# Patient Record
Sex: Female | Born: 1975 | Hispanic: Yes | Marital: Married | Smoking: Never smoker
Health system: Southern US, Community
[De-identification: ages and names within clinical notes are randomized; demographics above are authoritative.]

## PROBLEM LIST (undated history)

## (undated) DIAGNOSIS — F32A Depression, unspecified: Secondary | ICD-10-CM

## (undated) DIAGNOSIS — F419 Anxiety disorder, unspecified: Secondary | ICD-10-CM

## (undated) DIAGNOSIS — R87629 Unspecified abnormal cytological findings in specimens from vagina: Secondary | ICD-10-CM

## (undated) HISTORY — PX: OTHER SURGICAL HISTORY: SHX169

## (undated) HISTORY — DX: Anxiety disorder, unspecified: F41.9

## (undated) HISTORY — DX: Unspecified abnormal cytological findings in specimens from vagina: R87.629

## (undated) HISTORY — PX: GYNECOLOGIC CRYOSURGERY: SHX857

## (undated) HISTORY — DX: Depression, unspecified: F32.A

---

## 2011-11-30 ENCOUNTER — Encounter: Payer: Self-pay | Admitting: Obstetrics & Gynecology

## 2013-02-23 ENCOUNTER — Ambulatory Visit: Payer: Self-pay | Admitting: Physician Assistant

## 2013-02-23 ENCOUNTER — Ambulatory Visit (INDEPENDENT_AMBULATORY_CARE_PROVIDER_SITE_OTHER): Payer: BC Managed Care – PPO | Admitting: Physician Assistant

## 2013-02-23 ENCOUNTER — Encounter: Payer: Self-pay | Admitting: Physician Assistant

## 2013-02-23 VITALS — BP 82/49 | HR 69 | Wt 129.0 lb

## 2013-02-23 DIAGNOSIS — F32A Depression, unspecified: Secondary | ICD-10-CM

## 2013-02-23 DIAGNOSIS — R5381 Other malaise: Secondary | ICD-10-CM

## 2013-02-23 DIAGNOSIS — F329 Major depressive disorder, single episode, unspecified: Secondary | ICD-10-CM

## 2013-02-23 DIAGNOSIS — O99345 Other mental disorders complicating the puerperium: Secondary | ICD-10-CM

## 2013-02-23 DIAGNOSIS — R5383 Other fatigue: Secondary | ICD-10-CM

## 2013-02-23 NOTE — Progress Notes (Signed)
  Subjective:    Patient ID: Heather Haas, female    DOB: [birth year], 37 y.o.   MRN: [id number]  HPI Patient is a 37 yo female who presents to the clinic to establish care. NO PMH. NOt on any current medications. She did not like her previous doctor because he never explained anything to her and she was sick last year with abdominal pain and had all kinds of treatment but doesn't really know what happened. Abominal pain has resolved.   Pt presents to the clinic today with fatigue. She has had depression on and off since having her son 9 years ago. She is not currently on any medications. She sleeps fine more than 8 hours a night. She feels rested when she wakes up and no snoring. She has not had labs checked. She does not exercise regularly. Nothing seems to make fatigue worse or better.     Review of Systems  All other systems reviewed and are negative.       Objective:   Physical Exam  Constitutional: She is oriented to person, place, and time. She appears well-developed and well-nourished.  HENT:  Head: Normocephalic and atraumatic.  Eyes: Conjunctivae are normal.  Neck: Normal range of motion. Neck supple. No thyromegaly present.  Cardiovascular: Normal rate, regular rhythm and normal heart sounds.   Pulmonary/Chest: Effort normal and breath sounds normal.  Neurological: She is alert and oriented to person, place, and time.  Skin: Skin is warm and dry.  Psychiatric: She has a normal mood and affect. Her behavior is normal.          Assessment & Plan:  Fatigue- Will start with CBC/TSH/Vitamin D/B12. Certainly could be due to depression. Will get labs first and then discuss treatment for depression.

## 2013-02-24 LAB — CBC WITH DIFFERENTIAL/PLATELET
Basophils Relative: 0 % (ref 0–1)
Eosinophils Absolute: 0.3 10*3/uL (ref 0.0–0.7)
Eosinophils Relative: 4 % (ref 0–5)
HCT: 36.6 % (ref 36.0–46.0)
Hemoglobin: 12.6 g/dL (ref 12.0–15.0)
MCH: 28.5 pg (ref 26.0–34.0)
MCHC: 34.4 g/dL (ref 30.0–36.0)
MCV: 82.8 fL (ref 78.0–100.0)
Monocytes Absolute: 0.4 10*3/uL (ref 0.1–1.0)
Monocytes Relative: 5 % (ref 3–12)

## 2013-02-24 LAB — BASIC METABOLIC PANEL
BUN: 7 mg/dL (ref 6–23)
Chloride: 106 mEq/L (ref 96–112)
Glucose, Bld: 122 mg/dL — ABNORMAL HIGH (ref 70–99)
Potassium: 4.1 mEq/L (ref 3.5–5.3)

## 2013-02-25 DIAGNOSIS — F53 Postpartum depression: Secondary | ICD-10-CM | POA: Insufficient documentation

## 2013-02-25 DIAGNOSIS — O99345 Other mental disorders complicating the puerperium: Secondary | ICD-10-CM | POA: Insufficient documentation

## 2013-02-26 ENCOUNTER — Other Ambulatory Visit: Payer: Self-pay | Admitting: Physician Assistant

## 2013-02-26 MED ORDER — BUPROPION HCL ER (XL) 150 MG PO TB24: 150.0000 mg | ORAL_TABLET | Freq: Every day | ORAL | Status: DC

## 2013-03-02 ENCOUNTER — Encounter: Payer: Self-pay | Admitting: Physician Assistant

## 2013-03-02 ENCOUNTER — Ambulatory Visit (INDEPENDENT_AMBULATORY_CARE_PROVIDER_SITE_OTHER): Payer: BC Managed Care – PPO | Admitting: Physician Assistant

## 2013-03-02 VITALS — BP 90/55 | HR 61 | Wt 129.0 lb

## 2013-03-02 DIAGNOSIS — R1011 Right upper quadrant pain: Secondary | ICD-10-CM

## 2013-03-02 DIAGNOSIS — T148 Other injury of unspecified body region: Secondary | ICD-10-CM

## 2013-03-02 DIAGNOSIS — W57XXXA Bitten or stung by nonvenomous insect and other nonvenomous arthropods, initial encounter: Secondary | ICD-10-CM

## 2013-03-02 DIAGNOSIS — G589 Mononeuropathy, unspecified: Secondary | ICD-10-CM

## 2013-03-02 DIAGNOSIS — G629 Polyneuropathy, unspecified: Secondary | ICD-10-CM

## 2013-03-02 LAB — GLUCOSE, RANDOM: Glucose, Bld: 91 mg/dL (ref 70–99)

## 2013-03-02 LAB — HEPATIC FUNCTION PANEL
ALT: 10 U/L (ref 0–35)
AST: 14 U/L (ref 0–37)
Albumin: 4.7 g/dL (ref 3.5–5.2)
Alkaline Phosphatase: 41 U/L (ref 39–117)

## 2013-03-02 MED ORDER — GABAPENTIN 100 MG PO CAPS: 100.0000 mg | ORAL_CAPSULE | Freq: Every day | ORAL | Status: DC

## 2013-03-02 NOTE — Patient Instructions (Signed)
Gabapentin to start at night 1 hour before bed if improving can start taking 1 pill in the morning.   Continue on Wellbutrin every morning.   Follow up in 6 weeks.

## 2013-03-02 NOTE — Progress Notes (Signed)
  Subjective:    Patient ID: Heather Haas, female    DOB: 1976-08-05, 37 y.o.   MRN: [id number]  HPI Patient presents to the clinic to follow up on fatigue and burning in both feet. She came in to establish care but was not very clear about her main concern which was numbness and burning of both feet. She reports it has been going on for over 6 months. Nothing makes worse or better.Burning does tend to be worse at night than in the morning. Screening for diabetes have always been negative. She was bit by a tick and concerned about lymes. Does not remember rash. Denies any fever, chills, muscle aches. We did just recently start on Wellbutrin. No complaints and does feel like she has a little more energy and happier. Pt is concerned about liver disease and hepatitis would like to make sure liver is fine.   Review of Systems     Objective:   Physical Exam  Constitutional: She appears well-developed and well-nourished.  HENT:  Head: Normocephalic and atraumatic.  Cardiovascular: Normal rate, regular rhythm, normal heart sounds and intact distal pulses.   Pulmonary/Chest: Effort normal and breath sounds normal.  Abdominal: Soft. Bowel sounds are normal. There is no tenderness.  NO organomegaly.  Neurological: She is alert.  Skin: Skin is warm and dry.  Bilateral legs and feet unremarkable. Capillary refill under 3 sec.   Psychiatric: Her behavior is normal.  Flat affect.           Assessment & Plan:  Neuropathy/tick bite/Fatigue/depression- will test for lyme's. B12 was normal. Have not gotten a recent glucose fasting. Will get that. Pt concerned about liver disease. Will check for patient. Started Neurontin to see if helps. Continue on wellbutrin to help with fatigue and depression.

## 2013-03-14 ENCOUNTER — Encounter: Payer: Self-pay | Admitting: Physician Assistant

## 2013-03-14 ENCOUNTER — Ambulatory Visit (INDEPENDENT_AMBULATORY_CARE_PROVIDER_SITE_OTHER): Payer: BC Managed Care – PPO | Admitting: Physician Assistant

## 2013-03-14 VITALS — BP 100/60 | HR 79 | Wt 127.0 lb

## 2013-03-14 DIAGNOSIS — G479 Sleep disorder, unspecified: Secondary | ICD-10-CM

## 2013-03-14 DIAGNOSIS — R220 Localized swelling, mass and lump, head: Secondary | ICD-10-CM

## 2013-03-14 DIAGNOSIS — R221 Localized swelling, mass and lump, neck: Secondary | ICD-10-CM

## 2013-03-14 MED ORDER — FLUTICASONE PROPIONATE 50 MCG/ACT NA SUSP: 2.0000 | Freq: Every day | NASAL | Status: DC

## 2013-03-14 NOTE — Progress Notes (Signed)
  Subjective:    Patient ID: Heather Haas, female    DOB: June 05, 1976, 37 y.o.   MRN: [id number]  HPI Patient presents to the clinic with trouble sleeping for last week. Pt speaks english but it is not her primary language. She reports she is not sleepy and can not fall asleep until 5 in the morning but still wake up 2-3 hours later. She admits to stress but not able to articulate if something major has happened this week. She admits that she feels better and that her leg numbness has improved. She has been taking wellbutrin in the am since early June with no side effects.   She feels like there is a lump in her throat. Denies ST but feels like something can't get out. Pt has an occasional cough and dry. Denies any fever, ear pain, sinus pressure, SOB/Wheezing. She is concerned and wants to be tested for HPV. She states that she was told she needed to be. Denies oral sex. One parther.      Review of Systems     Objective:   Physical Exam  Constitutional: She appears well-developed and well-nourished.  HENT:  Head: Normocephalic and atraumatic.  Right Ear: External ear normal.  Left Ear: External ear normal.  Nose: Nose normal.  Mouth/Throat: Oropharynx is clear and moist. No oropharyngeal exudate.  Eyes: Conjunctivae are normal.  Neck: Normal range of motion. Neck supple.  Cardiovascular: Normal rate, regular rhythm and normal heart sounds.   Pulmonary/Chest: Effort normal and breath sounds normal. She has no wheezes.  Lymphadenopathy:    She has no cervical adenopathy.  Skin: Skin is warm and dry.  Psychiatric: Her behavior is normal.  Flat affect          Assessment & Plan:  Trouble sleeping- I will consider that wellbutrin could be causing. Taper to every other day for 3 days and then stop. See if sleeping is better. If not then start back on wellbutrin since helping. And try melatonin or valarian root for next couple of weeks. Follow up in 1 to 2 months.   Lump in throat- I  suspect allergies. Discussed taking zyrtec D daily and sent to pharmacy a nasal spray. Pt very concerned. I reassured I could send to ENT if not improving. I gave her CDC recommendations or oral HPV screening. We do not do in office. I told her no lesions were seen in mouth today. Not particpating in oral sex also decreases chances. If would like to pursue testing would nee to go to dentist or call around to somewhere that test.   Spent 30 minutes with patient and greater than 50 percent of time was spent counseling patient regarding HPV oral and lump in throat concerns.

## 2013-03-14 NOTE — Patient Instructions (Addendum)
Melatonin 3mg  to 10mg  1 hour before bedtime.  valarian Root 300mg  1 hour before bedtime.   Stop Wellbutrin for 2 weeks and see if start sleeping better. If sleeping better then will consider another antidepressant  Zyrtec D daily along with flonase 2 sprays each nostril once a day.

## 2013-04-13 ENCOUNTER — Ambulatory Visit (INDEPENDENT_AMBULATORY_CARE_PROVIDER_SITE_OTHER): Payer: BC Managed Care – PPO | Admitting: Physician Assistant

## 2013-04-13 ENCOUNTER — Encounter: Payer: Self-pay | Admitting: Physician Assistant

## 2013-04-13 VITALS — BP 101/62 | HR 70 | Wt 124.0 lb

## 2013-04-13 DIAGNOSIS — R198 Other specified symptoms and signs involving the digestive system and abdomen: Secondary | ICD-10-CM

## 2013-04-13 DIAGNOSIS — F53 Postpartum depression: Secondary | ICD-10-CM

## 2013-04-13 DIAGNOSIS — M62838 Other muscle spasm: Secondary | ICD-10-CM

## 2013-04-13 DIAGNOSIS — F329 Major depressive disorder, single episode, unspecified: Secondary | ICD-10-CM

## 2013-04-13 DIAGNOSIS — O99345 Other mental disorders complicating the puerperium: Secondary | ICD-10-CM

## 2013-04-13 LAB — POCT URINALYSIS DIPSTICK
Protein, UA: NEGATIVE
Spec Grav, UA: 1.015
Urobilinogen, UA: 0.2

## 2013-04-13 MED ORDER — CYCLOBENZAPRINE HCL 10 MG PO TABS
10.0000 mg | ORAL_TABLET | Freq: Two times a day (BID) | ORAL | Status: DC | PRN
Start: 2013-04-13 — End: 2014-02-05

## 2013-04-13 MED ORDER — BUPROPION HCL ER (XL) 150 MG PO TB24: 150.0000 mg | ORAL_TABLET | Freq: Every day | ORAL | Status: DC

## 2013-04-13 MED ORDER — MELOXICAM 15 MG PO TABS: 15.0000 mg | ORAL_TABLET | Freq: Every day | ORAL | Status: DC

## 2013-04-13 MED ORDER — GABAPENTIN 100 MG PO CAPS: ORAL_CAPSULE | ORAL | Status: DC

## 2013-04-13 NOTE — Patient Instructions (Addendum)
Align probiotic.   Mobic every day for 2 weeks. Consider massage. Flexeril use as needed for muscle tightness and spasm.  Use heat and ice. Consider epson salt baths.

## 2013-04-13 NOTE — Progress Notes (Signed)
  Subjective:    Patient ID: Heather Haas, female    DOB: October 23, 1975, 37 y.o.   MRN: [id number]  HPI Patient presents to the clinic to follow up on post partum depression. She is doing much better with wellburtrin. She needs a refill today. She has more energy. She denies any suicidal thoughts. She is trying to be more active but not very consistent.   Her restless legs are also better with neurontin as needed at bedtime.   She has had some abdominal fullness for the last couple of days and wants to make sure it is not an infection. She denies any discharge, abdominal pain, dysuria, urine odor, back pain, itching. She has not had any fever, chills, muscle aches.   She has had some neck pain and tightness. She describes it as a cold sensation over her back and up into her neck. She has no problem moving her neck. She denies any trauma. She does not feel ill. She has not tried anything to make better. Nothing seems to make worse.     Review of Systems     Objective:   Physical Exam  Constitutional: She is oriented to person, place, and time. She appears well-developed and well-nourished.  HENT:  Head: Normocephalic and atraumatic.  Cardiovascular: Normal rate, regular rhythm and normal heart sounds.   Pulmonary/Chest: Effort normal and breath sounds normal.  No CVA tenderness.  Abdominal: Soft. Bowel sounds are normal. She exhibits no distension and no mass. There is no tenderness. There is no rebound and no guarding.  Musculoskeletal:  Normal ROM of neck. No pain over c-spine. Tightness of paraspinus muscle of C-spine and T-spine.   Neurological: She is alert and oriented to person, place, and time.  Skin: Skin is warm and dry.  Psychiatric: She has a normal mood and affect. Her behavior is normal.          Assessment & Plan:  Post-partum depression- refilled wellbutrin. Follow up in 3 months. Stay active.   RLS- continue on neurontin as needed.  Abdominal pressure/fullness-  UA was negative for blood, leuks, nitrates. Suggestion was to take probiotics. Could be start of period and a anti-inflammatory might help. Follow up if continues.   Neck pain/spasms- Offered xray and were declined. Start with epson salt baths, flexeril as needed, consider a massage, ice and heat alternated, home exercises and mobic for 2 weeks. If not improving then call office.  Spent 30 minutes with patient and greater than 50 percent of visit spent counseling pt regarding neck pain and spasm.

## 2013-07-06 ENCOUNTER — Encounter: Payer: Self-pay | Admitting: Physician Assistant

## 2013-07-06 ENCOUNTER — Ambulatory Visit (INDEPENDENT_AMBULATORY_CARE_PROVIDER_SITE_OTHER): Payer: BC Managed Care – PPO | Admitting: Physician Assistant

## 2013-07-06 VITALS — BP 105/65 | HR 90 | Wt 136.0 lb

## 2013-07-06 DIAGNOSIS — R3989 Other symptoms and signs involving the genitourinary system: Secondary | ICD-10-CM

## 2013-07-06 DIAGNOSIS — R309 Painful micturition, unspecified: Secondary | ICD-10-CM

## 2013-07-06 DIAGNOSIS — R52 Pain, unspecified: Secondary | ICD-10-CM

## 2013-07-06 DIAGNOSIS — N390 Urinary tract infection, site not specified: Secondary | ICD-10-CM

## 2013-07-06 DIAGNOSIS — N23 Unspecified renal colic: Secondary | ICD-10-CM

## 2013-07-06 LAB — WET PREP FOR TRICH, YEAST, CLUE
Clue Cells Wet Prep HPF POC: NONE SEEN
Trich, Wet Prep: NONE SEEN

## 2013-07-06 LAB — POCT URINALYSIS DIPSTICK
Ketones, UA: NEGATIVE
Leukocytes, UA: NEGATIVE
Protein, UA: NEGATIVE
pH, UA: 7

## 2013-07-06 NOTE — Patient Instructions (Signed)
Continue to drink lots of water. Ibuprofen for general aches and pains.

## 2013-07-06 NOTE — Progress Notes (Signed)
  Subjective:    Patient ID: Heather Haas, female    DOB: 29-May-1976, 37 y.o.   MRN: [id number]  HPI Patient is a 37 year old female who presents to the clinic with dysuria for the last 2 days. Patient denies any fever, chills, nausea, vomiting or shortness of breath. She is also not had any vaginal discharge or unusal odor. She denies any urinary frequency or abdominal pain.  She's not had anything to make better and nothing seems to make worse. She does feel better today. Patient is concerned because she was cleaning 2 days ago and splashed some chemical in her eye. Since then she just has ached all over. She is concerned that she has some type of "poison" in her body. She does admit to feeling much better today.    Review of Systems     Objective:   Physical Exam  Constitutional: She is oriented to person, place, and time. She appears well-developed and well-nourished.  HENT:  Head: Normocephalic and atraumatic.  Eyes: Conjunctivae are normal. Right eye exhibits no discharge. Left eye exhibits no discharge.  Neck: Normal range of motion. Neck supple.  Cardiovascular: Normal rate, regular rhythm and normal heart sounds.   Pulmonary/Chest: Effort normal and breath sounds normal. She has no wheezes.  No CVA tenderness.  Abdominal: Soft. Bowel sounds are normal. She exhibits no mass. There is no tenderness. There is no rebound and no guarding.  Lymphadenopathy:    She has no cervical adenopathy.  Neurological: She is alert and oriented to person, place, and time.  Skin: Skin is warm and dry.  Psychiatric: She has a normal mood and affect. Her behavior is normal.          Assessment & Plan:  Dysuria- UA was negative for blood, nitrates, leukocytes. We'll send for urine culture. Wet prep sent off today stat. Reassured patient that she did not have infection today. Encouraged her to stay hydrated and rest. We'll call him to send over any other medications. Also reassured patient that the  symptoms did not from chemical in eye.  Generalized body ache-since improving this is very reassuring. I do not think that the chemical spill cause any of the symptoms. Suggest taking some ibuprofen if symptoms arise again to see if helps. Likely you have some arthritis and generalized aches and pains that would be resolved with NSAID.

## 2014-01-01 ENCOUNTER — Ambulatory Visit: Payer: BC Managed Care – PPO | Admitting: Family Medicine

## 2014-01-02 ENCOUNTER — Encounter: Payer: Self-pay | Admitting: Physician Assistant

## 2014-01-02 ENCOUNTER — Ambulatory Visit (INDEPENDENT_AMBULATORY_CARE_PROVIDER_SITE_OTHER): Payer: BC Managed Care – PPO

## 2014-01-02 ENCOUNTER — Ambulatory Visit (INDEPENDENT_AMBULATORY_CARE_PROVIDER_SITE_OTHER): Payer: BC Managed Care – PPO | Admitting: Physician Assistant

## 2014-01-02 VITALS — BP 87/51 | HR 74 | Wt 132.0 lb

## 2014-01-02 DIAGNOSIS — M47812 Spondylosis without myelopathy or radiculopathy, cervical region: Secondary | ICD-10-CM

## 2014-01-02 DIAGNOSIS — R200 Anesthesia of skin: Secondary | ICD-10-CM

## 2014-01-02 DIAGNOSIS — R209 Unspecified disturbances of skin sensation: Secondary | ICD-10-CM

## 2014-01-02 DIAGNOSIS — K529 Noninfective gastroenteritis and colitis, unspecified: Secondary | ICD-10-CM

## 2014-01-02 DIAGNOSIS — K5289 Other specified noninfective gastroenteritis and colitis: Secondary | ICD-10-CM

## 2014-01-02 DIAGNOSIS — M542 Cervicalgia: Secondary | ICD-10-CM

## 2014-01-02 NOTE — Progress Notes (Addendum)
   Subjective:    Patient ID: Heather Haas, female    DOB: 02-26-1976, 38 y.o.   MRN: [id number]  HPI Pt is a 38 yo female who presents to the clinic with main concern of bilateral hand numbness and tingling. She admits to having this off and on but concerned because got worse after eating old ham 4 days ago. She did have some gI upset for 24 hours but that has resolved. Her numbness and tingling is worse in the morning and then randomly. She does have some mild off and on again neck pain. No trauma/injury. She has done nothing for pain. She is not in any discomfort today. Sometimes her hands just feel like they are burning as well. No fever, chills, vision changes, nausea or vomiting currently.    Review of Systems     Objective:   Physical Exam  Constitutional: She is oriented to person, place, and time. She appears well-developed and well-nourished.  HENT:  Head: Normocephalic and atraumatic.  Cardiovascular: Normal rate, regular rhythm and normal heart sounds.   Pulmonary/Chest: Effort normal and breath sounds normal.  Abdominal: Soft. She exhibits no distension and no mass. There is no tenderness. There is no rebound and no guarding.  Musculoskeletal:  Hand grip bilaterally 5/5.  No atrophy of thenar eminence bilaterally.  ROM of bilateral wrist normal.  Negative phalens and tinels.   NROM of head and neck. Negative spurlings signs.   Neurological: She is alert and oriented to person, place, and time.  Sensations of bilateral upper extremities intact.   Skin: Skin is dry.  Psychiatric: She has a normal mood and affect. Her behavior is normal.          Assessment & Plan:  Bilateral hand numbness/neck pain- sounds like could be some radiculopathy will get cervical xray. Cannot completely exclude carpel tunnel. Did give handout and told to start sleeping in wrist splints. Consider ibuprofen when pain arises. Follow up in 2-4 weeks.   Gastroenteritis- reassured pt that I feel  like hand numbness and GI upset are not related. Likely ham did not sit well on her stomach.

## 2014-01-02 NOTE — Patient Instructions (Signed)
Wrist braces at night. Consider ibuprofen 200-467m before bed.   Carpal Tunnel Syndrome The carpal tunnel is a narrow area located on the palm side of your wrist. The tunnel is formed by the wrist bones and ligaments. Nerves, blood vessels, and tendons pass through the carpal tunnel. Repeated wrist motion or certain diseases may cause swelling within the tunnel. This swelling pinches the main nerve in the wrist (median nerve) and causes the painful hand and arm condition called carpal tunnel syndrome. CAUSES   Repeated wrist motions.  Wrist injuries.  Certain diseases like arthritis, diabetes, alcoholism, hyperthyroidism, and kidney failure.  Obesity.  Pregnancy. SYMPTOMS   A "pins and needles" feeling in your fingers or hand.  Tingling or numbness in your fingers or hand.  An aching feeling in your entire arm.  Wrist pain that goes up your arm to your shoulder.  Pain that goes down into your palm or fingers.  A weak feeling in your hands. DIAGNOSIS  Your caregiver will take your history and perform a physical exam. An electromyography test may be needed. This test measures electrical signals sent out by the muscles. The electrical signals are usually slowed by carpal tunnel syndrome. You may also need X-rays. TREATMENT  Carpal tunnel syndrome may clear up by itself. Your caregiver may recommend a wrist splint or medicine such as a nonsteroidal anti-inflammatory medicine. Cortisone injections may help. Sometimes, surgery may be needed to free the pinched nerve.  HOME CARE INSTRUCTIONS   Take all medicine as directed by your caregiver. Only take over-the-counter or prescription medicines for pain, discomfort, or fever as directed by your caregiver.  If you were given a splint to keep your wrist from bending, wear it as directed. It is important to wear the splint at night. Wear the splint for as long as you have pain or numbness in your hand, arm, or wrist. This may take 1 to 2  months.  Rest your wrist from any activity that may be causing your pain. If your symptoms are work-related, you may need to talk to your employer about changing to a job that does not require using your wrist.  Put ice on your wrist after long periods of wrist activity.  Put ice in a plastic bag.  Place a towel between your skin and the bag.  Leave the ice on for 15-20 minutes, 03-04 times a day.  Keep all follow-up visits as directed by your caregiver. This includes any orthopedic referrals, physical therapy, and rehabilitation. Any delay in getting necessary care could result in a delay or failure of your condition to heal. SEEK IMMEDIATE MEDICAL CARE IF:   You have new, unexplained symptoms.  Your symptoms get worse and are not helped or controlled with medicines. MAKE SURE YOU:   Understand these instructions.  Will watch your condition.  Will get help right away if you are not doing well or get worse. Document Released: 09/03/2000 Document Revised: 11/29/2011 Document Reviewed: 07/23/2011 EAdv[facility] Shepherd HospitalPatient Information 2014 [place]

## 2014-01-04 ENCOUNTER — Other Ambulatory Visit: Payer: Self-pay | Admitting: Physician Assistant

## 2014-01-04 DIAGNOSIS — M47812 Spondylosis without myelopathy or radiculopathy, cervical region: Secondary | ICD-10-CM

## 2014-01-04 DIAGNOSIS — R200 Anesthesia of skin: Secondary | ICD-10-CM

## 2014-02-05 ENCOUNTER — Encounter: Payer: Self-pay | Admitting: Physician Assistant

## 2014-02-05 ENCOUNTER — Ambulatory Visit (INDEPENDENT_AMBULATORY_CARE_PROVIDER_SITE_OTHER): Payer: BC Managed Care – PPO | Admitting: Physician Assistant

## 2014-02-05 VITALS — BP 102/59 | HR 86 | Wt 137.0 lb

## 2014-02-05 DIAGNOSIS — L255 Unspecified contact dermatitis due to plants, except food: Secondary | ICD-10-CM

## 2014-02-05 DIAGNOSIS — M47812 Spondylosis without myelopathy or radiculopathy, cervical region: Secondary | ICD-10-CM

## 2014-02-05 DIAGNOSIS — L237 Allergic contact dermatitis due to plants, except food: Secondary | ICD-10-CM

## 2014-02-05 MED ORDER — METHYLPREDNISOLONE SODIUM SUCC 125 MG IJ SOLR
125.0000 mg | Freq: Once | INTRAMUSCULAR | Status: AC
  Administered 2014-02-05: 125 mg via INTRAMUSCULAR

## 2014-02-05 MED ORDER — CLOBETASOL PROPIONATE 0.05 % EX CREA: 1.0000 "application " | TOPICAL_CREAM | Freq: Two times a day (BID) | CUTANEOUS | Status: DC

## 2014-02-05 MED ORDER — PREDNISONE 50 MG PO TABS: ORAL_TABLET | ORAL | Status: DC

## 2014-02-05 MED ORDER — CYCLOBENZAPRINE HCL 10 MG PO TABS: 10.0000 mg | ORAL_TABLET | Freq: Two times a day (BID) | ORAL | Status: DC | PRN

## 2014-02-05 NOTE — Progress Notes (Signed)
   Subjective:    Patient ID: Heather Haas, female    DOB: 1975-11-19, 38 y.o.   MRN: [id number]  HPI Pt is a 38 yo female who presents to the clinic with rash all over bilateral arms, trunk, hands, bilateral legs. She was working out in her garden this weekend and came in with a little bump on her right forearm. She squeezed it and a little bit of fluid came out. She noticed the itching starting and then the little bumps seemed to spread. No fever, chills, nausea, vomiting, diarrhea, sore throat, ear pain or sinus pressure. She's never had poison ivy before. She used calamine lotion to help with itching it has helped minimally.   Review of Systems  All other systems reviewed and are negative.      Objective:   Physical Exam  Constitutional: She is oriented to person, place, and time. She appears well-developed and well-nourished.  HENT:  Head: Normocephalic and atraumatic.  Right Ear: External ear normal.  Left Ear: External ear normal.  Nose: Nose normal.  Mouth/Throat: Oropharynx is clear and moist. No oropharyngeal exudate.  TMs clear bilaterally  Eyes: Conjunctivae are normal. Right eye exhibits no discharge. Left eye exhibits no discharge.  Neck: Normal range of motion. Neck supple.  Cardiovascular: Normal rate, regular rhythm and normal heart sounds.   Pulmonary/Chest: Effort normal and breath sounds normal. She has no wheezes.  Lymphadenopathy:    She has no cervical adenopathy.  Neurological: She is alert and oriented to person, place, and time.  Skin:     Psychiatric: She has a normal mood and affect. Her behavior is normal.          Assessment & Plan:  Poison ivy dermatitis- a shot of Solu-Medrol 125 mg IM given today. Prednisone 50 mg for 5 days to start tomorrow. Prescription pertinent for topical steroid only to use if needed. Consider using Benadryl over-the-counter for itching. May also go on Zyrtec 10 mg daily as well as needed for itching and dermatitis.  Followup if not improving or worsening.  Cervical spondylosis-Flexeril given for patient to take as needed for nightly for muscle spasms and neck pain. Refill was given.

## 2014-02-05 NOTE — Patient Instructions (Signed)
Poison Ivy Poison ivy is a inflammation of the skin (contact dermatitis) caused by touching the allergens on the leaves of the ivy plant following previous exposure to the plant. The rash usually appears 48 hours after exposure. The rash is usually bumps (papules) or blisters (vesicles) in a linear pattern. Depending on your own sensitivity, the rash may simply cause redness and itching, or it may also progress to blisters which may break open. These must be well cared for to prevent secondary bacterial (germ) infection, followed by scarring. Keep any open areas dry, clean, dressed, and covered with an antibacterial ointment if needed. The eyes may also get puffy. The puffiness is worst in the morning and gets better as the day progresses. This dermatitis usually heals without scarring, within 2 to 3 weeks without treatment. HOME CARE INSTRUCTIONS  Thoroughly wash with soap and water as soon as you have been exposed to poison ivy. You have about one half hour to remove the plant resin before it will cause the rash. This washing will destroy the oil or antigen on the skin that is causing, or will cause, the rash. Be sure to wash under your fingernails as any plant resin there will continue to spread the rash. Do not rub skin vigorously when washing affected area. Poison ivy cannot spread if no oil from the plant remains on your body. A rash that has progressed to weeping sores will not spread the rash unless you have not washed thoroughly. It is also important to wash any clothes you have been wearing as these may carry active allergens. The rash will return if you wear the unwashed clothing, even several days later. Avoidance of the plant in the future is the best measure. Poison ivy plant can be recognized by the number of leaves. Generally, poison ivy has three leaves with flowering branches on a single stem. Diphenhydramine may be purchased over the counter and used as needed for itching. Do not drive with  this medication if it makes you drowsy.Ask your caregiver about medication for children. SEEK MEDICAL CARE IF:  Open sores develop.  Redness spreads beyond area of rash.  You notice purulent (pus-like) discharge.  You have increased pain.  Other signs of infection develop (such as fever). Document Released: 09/03/2000 Document Revised: 11/29/2011 Document Reviewed: 07/23/2009 [employer] Patient Information 2014 [employer].  

## 2014-02-26 ENCOUNTER — Other Ambulatory Visit: Payer: Self-pay | Admitting: Physician Assistant

## 2014-04-12 ENCOUNTER — Other Ambulatory Visit: Payer: Self-pay | Admitting: *Deleted

## 2014-04-12 MED ORDER — CYCLOBENZAPRINE HCL 10 MG PO TABS: 10.0000 mg | ORAL_TABLET | Freq: Two times a day (BID) | ORAL | Status: DC | PRN

## 2015-07-08 ENCOUNTER — Ambulatory Visit (INDEPENDENT_AMBULATORY_CARE_PROVIDER_SITE_OTHER): Payer: BC Managed Care – PPO | Admitting: Family Medicine

## 2015-07-08 ENCOUNTER — Encounter: Payer: Self-pay | Admitting: Family Medicine

## 2015-07-08 VITALS — BP 104/61 | HR 77 | Wt 132.0 lb

## 2015-07-08 DIAGNOSIS — L309 Dermatitis, unspecified: Secondary | ICD-10-CM

## 2015-07-08 MED ORDER — TRIAMCINOLONE ACETONIDE 0.5 % EX OINT: 1.0000 "application " | TOPICAL_OINTMENT | Freq: Two times a day (BID) | CUTANEOUS | Status: DC

## 2015-07-08 NOTE — Progress Notes (Signed)
Heather Haas is a 39 y.o. female who presents to [facility]  today for rash. Patient has a small erythematous pruritic rash on her left trunk and left thigh. Been there for a few weeks. She denies any fevers chills nausea vomiting or diarrhea. She suffered a bug bite in this area a few months ago but previously was doing well. She's not had any significant treatment. No other rash. No new medications of detergents or shampoos cosmetics etc.   No past medical history on file. Past Surgical History  Procedure Laterality Date  . Gynecologic cryosurgery     Social History  Substance Use Topics  . Smoking status: Never Smoker   . Smokeless tobacco: Not on file  . Alcohol Use: No   family history includes Alcohol abuse in her brother and father; Cancer in her maternal aunt; Diabetes in her maternal uncle and paternal grandmother; Stroke in her maternal grandmother.  ROS as above Medications: Current Outpatient Prescriptions  Medication Sig Dispense Refill  . triamcinolone ointment (KENALOG) 0.5 % Apply 1 application topically 2 (two) times daily. To affected area, avoid eyes and face 30 g 3   No current facility-administered medications for this visit.   Allergies  Allergen Reactions  . Diflucan [Fluconazole]     RASH.   Marland Kitchen Penicillins      Exam:  BP 104/61 mmHg  Pulse 77  Wt 132 lb (59.875 kg) Gen: Well NAD HEENT: EOMI,  MMM Lungs: Normal work of breathing. CTABL Heart: RRR no MRG Abd: NABS, Soft. Nondistended, Nontender Exts: Brisk capillary refill, warm and well perfused.  Skin: Erythematous macular lesions approximately 1 cm x 0.5 cm left trunk. Nontender. Tiny erythematous papule on left side.  No results found for this or any previous visit (from the past 24 hour(s)). No results found.   Please see individual assessment and plan sections.

## 2015-07-08 NOTE — Assessment & Plan Note (Signed)
Unclear etiology. This looks like a herald patch for pityriasis rosea however she has no other lesions yet. This may also be some sort of contact dermatitis. Trial of triamcinolone ointment. Return as needed.

## 2015-07-08 NOTE — Patient Instructions (Signed)
Thank you for coming in today. Apply the triamcinolone ointment twice daily until the itching goes away. This looks like a dermatitis which is a nonspecific irritation of the skin. This may also be early pityriasis rosea  Return if worsening or not improved.   Contact Dermatitis Dermatitis is redness, soreness, and swelling (inflammation) of the skin. Contact dermatitis is a reaction to certain substances that touch the skin. There are two types of contact dermatitis:   Irritant contact dermatitis. This type is caused by something that irritates your skin, such as dry hands from washing them too much. This type does not require previous exposure to the substance for a reaction to occur. This type is more common.  Allergic contact dermatitis. This type is caused by a substance that you are allergic to, such as a nickel allergy or poison ivy. This type only occurs if you have been exposed to the substance (allergen) before. Upon a repeat exposure, your body reacts to the substance. This type is less common. CAUSES  Many different substances can cause contact dermatitis. Irritant contact dermatitis is most commonly caused by exposure to:   Makeup.   Soaps.   Detergents.   Bleaches.   Acids.   Metal salts, such as nickel.  Allergic contact dermatitis is most commonly caused by exposure to:   Poisonous plants.   Chemicals.   Jewelry.   Latex.   Medicines.   Preservatives in products, such as clothing.  RISK FACTORS This condition is more likely to develop in:   People who have jobs that expose them to irritants or allergens.  People who have certain medical conditions, such as asthma or eczema.  SYMPTOMS  Symptoms of this condition may occur anywhere on your body where the irritant has touched you or is touched by you. Symptoms include:  Dryness or flaking.   Redness.   Cracks.   Itching.   Pain or a burning feeling.   Blisters.  Drainage of  small amounts of blood or clear fluid from skin cracks. With allergic contact dermatitis, there may also be swelling in areas such as the eyelids, mouth, or genitals.  DIAGNOSIS  This condition is diagnosed with a medical history and physical exam. A patch skin test may be performed to help determine the cause. If the condition is related to your job, you may need to see an occupational medicine specialist. TREATMENT Treatment for this condition includes figuring out what caused the reaction and protecting your skin from further contact. Treatment may also include:   Steroid creams or ointments. Oral steroid medicines may be needed in more severe cases.  Antibiotics or antibacterial ointments, if a skin infection is present.  Antihistamine lotion or an antihistamine taken by mouth to ease itching.  A bandage (dressing). HOME CARE INSTRUCTIONS Skin Care  Moisturize your skin as needed.   Apply cool compresses to the affected areas.  Try taking a bath with:  Epsom salts. Follow the instructions on the packaging. You can get these at your local pharmacy or grocery store.  Baking soda. Pour a small amount into the bath as directed by your health care provider.  Colloidal oatmeal. Follow the instructions on the packaging. You can get this at your local pharmacy or grocery store.  Try applying baking soda paste to your skin. Stir water into baking soda until it reaches a paste-like consistency.  Do not scratch your skin.  Bathe less frequently, such as every other day.  Bathe in lukewarm water. Avoid  using hot water. Medicines  Take or apply over-the-counter and prescription medicines only as told by your health care provider.   If you were prescribed an antibiotic medicine, take or apply your antibiotic as told by your health care provider. Do not stop using the antibiotic even if your condition starts to improve. General Instructions  Keep all follow-up visits as told by  your health care provider. This is important.  Avoid the substance that caused your reaction. If you do not know what caused it, keep a journal to try to track what caused it. Write down:  What you eat.  What cosmetic products you use.  What you drink.  What you wear in the affected area. This includes jewelry.  If you were given a dressing, take care of it as told by your health care provider. This includes when to change and remove it. SEEK MEDICAL CARE IF:   Your condition does not improve with treatment.  Your condition gets worse.  You have signs of infection such as swelling, tenderness, redness, soreness, or warmth in the affected area.  You have a fever.  You have new symptoms. SEEK IMMEDIATE MEDICAL CARE IF:   You have a severe headache, neck pain, or neck stiffness.  You vomit.  You feel very sleepy.  You notice red streaks coming from the affected area.  Your bone or joint underneath the affected area becomes painful after the skin has healed.  The affected area turns darker.  You have difficulty breathing.   This information is not intended to replace advice given to you by your health care provider. Make sure you discuss any questions you have with your health care provider.   Document Released: 09/03/2000 Document Revised: 05/28/2015 Document Reviewed: 01/22/2015 Elsevier Interactive Patient Education 2016 [employer]   Pityriasis Rosea Pityriasis rosea is a rash that usually appears on the trunk of the body. It may also appear on the upper arms and upper legs. It usually begins as a single patch, and then more patches begin to develop. The rash may cause mild itching, but it normally does not cause other problems. It usually goes away without treatment. However, it may take weeks or months for the rash to go away completely. CAUSES The cause of this condition is not known. The condition does not spread from person to person (is noncontagious). RISK  FACTORS This condition is more likely to develop in young adults and children. It is most common in the spring and fall. SYMPTOMS The main symptom of this condition is a rash.  The rash usually begins with a single oval patch that is larger than the ones that follow. This is called a herald patch. It generally appears a week or more before the rest of the rash appears.  When more patches start to develop, they spread quickly on the trunk, back, and arms. These patches are smaller than the first one.  The patches that make up the rash are usually oval-shaped and pink or red in color. They are usually flat, but they may sometimes be raised so that they can be felt with a finger. They may also be finely crinkled and have a scaly ring around the edge.  The rash does not typically appear on areas of the skin that are exposed to the sun. Most people who have this condition do not have other symptoms, but some have mild itching. In a few cases, a mild headache or body aches may occur before the rash  appears and then go away. DIAGNOSIS Your health care provider may diagnose this condition by doing a physical exam and taking your medical history. To rule out other possible causes for the rash, the health care provider may order blood tests or take a skin sample from the rash to be looked at under a microscope. TREATMENT Usually, treatment is not needed for this condition. The rash will probably go away on its own in 4-8 weeks. In some cases, a health care provider may recommend or prescribe medicine to reduce itching. HOME CARE INSTRUCTIONS  Take medicines only as directed by your health care provider.  Avoid scratching the affected areas of skin.  Do not take hot baths or use a sauna. Use only warm water when bathing or showering. Heat can increase itching. SEEK MEDICAL CARE IF:  Your rash does not go away in 8 weeks.  Your rash gets much worse.  You have a fever.  You have swelling or pain  in the rash area.  You have fluid, blood, or pus coming from the rash area.   This information is not intended to replace advice given to you by your health care provider. Make sure you discuss any questions you have with your health care provider.   Document Released: 10/13/2001 Document Revised: 01/21/2015 Document Reviewed: 08/14/2014 Elsevier Interactive Patient Education [employer].

## 2015-12-23 ENCOUNTER — Ambulatory Visit: Payer: BC Managed Care – PPO | Admitting: Physician Assistant

## 2016-01-28 ENCOUNTER — Ambulatory Visit (INDEPENDENT_AMBULATORY_CARE_PROVIDER_SITE_OTHER): Payer: BC Managed Care – PPO | Admitting: Physician Assistant

## 2016-01-28 ENCOUNTER — Encounter: Payer: Self-pay | Admitting: Physician Assistant

## 2016-01-28 ENCOUNTER — Ambulatory Visit: Payer: BC Managed Care – PPO | Admitting: Physician Assistant

## 2016-01-28 VITALS — BP 93/42 | HR 62 | Wt 129.0 lb

## 2016-01-28 DIAGNOSIS — M542 Cervicalgia: Secondary | ICD-10-CM | POA: Diagnosis not present

## 2016-01-28 DIAGNOSIS — K1379 Other lesions of oral mucosa: Secondary | ICD-10-CM

## 2016-01-28 DIAGNOSIS — R202 Paresthesia of skin: Secondary | ICD-10-CM | POA: Diagnosis not present

## 2016-01-28 LAB — COMPLETE METABOLIC PANEL WITH GFR
ALBUMIN: 4.5 g/dL (ref 3.6–5.1)
ALK PHOS: 41 U/L (ref 33–115)
ALT: 14 U/L (ref 6–29)
AST: 17 U/L (ref 10–30)
BILIRUBIN TOTAL: 1 mg/dL (ref 0.2–1.2)
BUN: 12 mg/dL (ref 7–25)
CO2: 25 mmol/L (ref 20–31)
CREATININE: 0.73 mg/dL (ref 0.50–1.10)
Calcium: 9.2 mg/dL (ref 8.6–10.2)
Chloride: 105 mmol/L (ref 98–110)
GLUCOSE: 92 mg/dL (ref 65–99)
Potassium: 4.2 mmol/L (ref 3.5–5.3)
SODIUM: 140 mmol/L (ref 135–146)
TOTAL PROTEIN: 7.1 g/dL (ref 6.1–8.1)

## 2016-01-28 LAB — VITAMIN B12: VITAMIN B 12: 717 pg/mL (ref 200–1100)

## 2016-01-28 LAB — TSH: TSH: 1.72 mIU/L

## 2016-01-28 MED ORDER — CYCLOBENZAPRINE HCL 10 MG PO TABS: 10.0000 mg | ORAL_TABLET | Freq: Three times a day (TID) | ORAL | Status: DC | PRN

## 2016-01-28 NOTE — Progress Notes (Signed)
   Subjective:    Patient ID: Heather Haas, female    DOB: Jul 31, 1976, 39 y.o.   MRN: [id number]  HPI  Pt is a 40 yo female who presents to the clinic with papule in mouth for last year. No pain associated. Seems to be getting a little bigger. Started more white and transitioning to flesh/pink color. No other lesions.   She is also having some off and on neck pain and stiffness with tingling of extremities. No symptoms today. At times will last a few days. Nothing tried to make better. No known neck injury or trauma. She often feels like she needs muscle relaxer but does not have one. Tingling shoots down both arms into fingers. She feels like all her fingers.    Review of Systems  All other systems reviewed and are negative.      Objective:   Physical Exam  Constitutional: She is oriented to person, place, and time. She appears well-developed and well-nourished.  HENT:  Head: Normocephalic and atraumatic.  Mouth/Throat:    Neck: Normal range of motion. Neck supple.  No tenderness over cervical spine to palpation.   Pulmonary/Chest: Effort normal and breath sounds normal.  Musculoskeletal:  Full ROM of upper extremities.  Strength 5/5.   Lymphadenopathy:    She has no cervical adenopathy.  Neurological: She is alert and oriented to person, place, and time.  Psychiatric: She has a normal mood and affect. Her behavior is normal.          Assessment & Plan:  Lesion of palate- appears to be like a benign skin tag. I have not personally seen anything like this before in mouth. I am no overtly concerned.Suggested to go to dentist for regular cleaning and get dentist advice. Gave her info on local dentist.   Neck pain- seems like she could be having muscle tightness and spasms. Given flexeril to use when occurs. Discussed NSAIDs as needed. Encouraged Ice/heat/biofreeze. Exercises given.   Tingling of extremities- will check TSH, B12, vitamin D, CBC. Certainly could be caused by  some musculoskeletal swelling/tightness.

## 2016-01-28 NOTE — Patient Instructions (Addendum)
[place], [place], [place] [address]  Phone: [phone]   [facility]  Biofreeze/heat/ibuprofen/muscle relaxer  Cervical Strain and Sprain With Rehab Cervical strain and sprain are injuries that commonly occur with "whiplash" injuries. Whiplash occurs when the neck is forcefully whipped backward or forward, such as during a motor vehicle accident or during contact sports. The muscles, ligaments, tendons, discs, and nerves of the neck are susceptible to injury when this occurs. RISK FACTORS Risk of having a whiplash injury increases if:  Osteoarthritis of the spine.  Situations that make head or neck accidents or trauma more likely.  High-risk sports (football, rugby, wrestling, hockey, auto racing, gymnastics, diving, contact karate, or boxing).  Poor strength and flexibility of the neck.  Previous neck injury.  Poor tackling technique.  Improperly fitted or padded equipment. SYMPTOMS   Pain or stiffness in the front or back of neck or both.  Symptoms may present immediately or up to 24 hours after injury.  Dizziness, headache, nausea, and vomiting.  Muscle spasm with soreness and stiffness in the neck.  Tenderness and swelling at the injury site. PREVENTION  Learn and use proper technique (avoid tackling with the head, spearing, and head-butting; use proper falling techniques to avoid landing on the head).  Warm up and stretch properly before activity.  Maintain physical fitness:  Strength, flexibility, and endurance.  Cardiovascular fitness.  Wear properly fitted and padded protective equipment, such as padded soft collars, for participation in contact sports. PROGNOSIS  Recovery from cervical strain and sprain injuries is dependent on the extent of the injury. These injuries are usually curable in 1 week to 3 months with appropriate treatment.  RELATED COMPLICATIONS   Temporary numbness and weakness may occur if the nerve roots are damaged, and this may  persist until the nerve has completely healed.  Chronic pain due to frequent recurrence of symptoms.  Prolonged healing, especially if activity is resumed too soon (before complete recovery). TREATMENT  Treatment initially involves the use of ice and medication to help reduce pain and inflammation. It is also important to perform strengthening and stretching exercises and modify activities that worsen symptoms so the injury does not get worse. These exercises may be performed at home or with a therapist. For patients who experience severe symptoms, a soft, padded collar may be recommended to be worn around the neck.  Improving your posture may help reduce symptoms. Posture improvement includes pulling your chin and abdomen in while sitting or standing. If you are sitting, sit in a firm chair with your buttocks against the back of the chair. While sleeping, try replacing your pillow with a small towel rolled to 2 inches in diameter, or use a cervical pillow or soft cervical collar. Poor sleeping positions delay healing.  For patients with nerve root damage, which causes numbness or weakness, the use of a cervical traction apparatus may be recommended. Surgery is rarely necessary for these injuries. However, cervical strain and sprains that are present at birth (congenital) may require surgery. MEDICATION   If pain medication is necessary, nonsteroidal anti-inflammatory medications, such as aspirin and ibuprofen, or other minor pain relievers, such as acetaminophen, are often recommended.  Do not take pain medication for 7 days before surgery.  Prescription pain relievers may be given if deemed necessary by your caregiver. Use only as directed and only as much as you need. HEAT AND COLD:   Cold treatment (icing) relieves pain and reduces inflammation. Cold treatment should be applied for 10 to 15  minutes every 2 to 3 hours for inflammation and pain and immediately after any activity that aggravates  your symptoms. Use ice packs or an ice massage.  Heat treatment may be used prior to performing the stretching and strengthening activities prescribed by your caregiver, physical therapist, or athletic trainer. Use a heat pack or a warm soak. SEEK MEDICAL CARE IF:   Symptoms get worse or do not improve in 2 weeks despite treatment.  New, unexplained symptoms develop (drugs used in treatment may produce side effects). EXERCISES RANGE OF MOTION (ROM) AND STRETCHING EXERCISES - Cervical Strain and Sprain These exercises may help you when beginning to rehabilitate your injury. In order to successfully resolve your symptoms, you must improve your posture. These exercises are designed to help reduce the forward-head and rounded-shoulder posture which contributes to this condition. Your symptoms may resolve with or without further involvement from your physician, physical therapist or athletic trainer. While completing these exercises, remember:   Restoring tissue flexibility helps normal motion to return to the joints. This allows healthier, less painful movement and activity.  An effective stretch should be held for at least 20 seconds, although you may need to begin with shorter hold times for comfort.  A stretch should never be painful. You should only feel a gentle lengthening or release in the stretched tissue. STRETCH- Axial Extensors  Lie on your back on the floor. You may bend your knees for comfort. Place a rolled-up hand towel or dish towel, about 2 inches in diameter, under the part of your head that makes contact with the floor.  Gently tuck your chin, as if trying to make a "double chin," until you feel a gentle stretch at the base of your head.  Hold __________ seconds. Repeat __________ times. Complete this exercise __________ times per day.  STRETCH - Axial Extension   Stand or sit on a firm surface. Assume a good posture: chest up, shoulders drawn back, abdominal muscles  slightly tense, knees unlocked (if standing) and feet hip width apart.  Slowly retract your chin so your head slides back and your chin slightly lowers. Continue to look straight ahead.  You should feel a gentle stretch in the back of your head. Be certain not to feel an aggressive stretch since this can cause headaches later.  Hold for __________ seconds. Repeat __________ times. Complete this exercise __________ times per day. STRETCH - Cervical Side Bend   Stand or sit on a firm surface. Assume a good posture: chest up, shoulders drawn back, abdominal muscles slightly tense, knees unlocked (if standing) and feet hip width apart.  Without letting your nose or shoulders move, slowly tip your right / left ear to your shoulder until your feel a gentle stretch in the muscles on the opposite side of your neck.  Hold __________ seconds. Repeat __________ times. Complete this exercise __________ times per day. STRETCH - Cervical Rotators   Stand or sit on a firm surface. Assume a good posture: chest up, shoulders drawn back, abdominal muscles slightly tense, knees unlocked (if standing) and feet hip width apart.  Keeping your eyes level with the ground, slowly turn your head until you feel a gentle stretch along the back and opposite side of your neck.  Hold __________ seconds. Repeat __________ times. Complete this exercise __________ times per day. RANGE OF MOTION - Neck Circles   Stand or sit on a firm surface. Assume a good posture: chest up, shoulders drawn back, abdominal muscles slightly tense, knees unlocked (  if standing) and feet hip width apart.  Gently roll your head down and around from the back of one shoulder to the back of the other. The motion should never be forced or painful.  Repeat the motion 10-20 times, or until you feel the neck muscles relax and loosen. Repeat __________ times. Complete the exercise __________ times per day. STRENGTHENING EXERCISES - Cervical Strain  and Sprain These exercises may help you when beginning to rehabilitate your injury. They may resolve your symptoms with or without further involvement from your physician, physical therapist, or athletic trainer. While completing these exercises, remember:   Muscles can gain both the endurance and the strength needed for everyday activities through controlled exercises.  Complete these exercises as instructed by your physician, physical therapist, or athletic trainer. Progress the resistance and repetitions only as guided.  You may experience muscle soreness or fatigue, but the pain or discomfort you are trying to eliminate should never worsen during these exercises. If this pain does worsen, stop and make certain you are following the directions exactly. If the pain is still present after adjustments, discontinue the exercise until you can discuss the trouble with your clinician. STRENGTH - Cervical Flexors, Isometric  Face a wall, standing about 6 inches away. Place a small pillow, a ball about 6-8 inches in diameter, or a folded towel between your forehead and the wall.  Slightly tuck your chin and gently push your forehead into the soft object. Push only with mild to moderate intensity, building up tension gradually. Keep your jaw and forehead relaxed.  Hold 10 to 20 seconds. Keep your breathing relaxed.  Release the tension slowly. Relax your neck muscles completely before you start the next repetition. Repeat __________ times. Complete this exercise __________ times per day. STRENGTH- Cervical Lateral Flexors, Isometric   Stand about 6 inches away from a wall. Place a small pillow, a ball about 6-8 inches in diameter, or a folded towel between the side of your head and the wall.  Slightly tuck your chin and gently tilt your head into the soft object. Push only with mild to moderate intensity, building up tension gradually. Keep your jaw and forehead relaxed.  Hold 10 to 20 seconds. Keep  your breathing relaxed.  Release the tension slowly. Relax your neck muscles completely before you start the next repetition. Repeat __________ times. Complete this exercise __________ times per day. STRENGTH - Cervical Extensors, Isometric   Stand about 6 inches away from a wall. Place a small pillow, a ball about 6-8 inches in diameter, or a folded towel between the back of your head and the wall.  Slightly tuck your chin and gently tilt your head back into the soft object. Push only with mild to moderate intensity, building up tension gradually. Keep your jaw and forehead relaxed.  Hold 10 to 20 seconds. Keep your breathing relaxed.  Release the tension slowly. Relax your neck muscles completely before you start the next repetition. Repeat __________ times. Complete this exercise __________ times per day. POSTURE AND BODY MECHANICS CONSIDERATIONS - Cervical Strain and Sprain Keeping correct posture when sitting, standing or completing your activities will reduce the stress put on different body tissues, allowing injured tissues a chance to heal and limiting painful experiences. The following are general guidelines for improved posture. Your physician or physical therapist will provide you with any instructions specific to your needs. While reading these guidelines, remember:  The exercises prescribed by your provider will help you have the flexibility and  strength to maintain correct postures.  The correct posture provides the optimal environment for your joints to work. All of your joints have less wear and tear when properly supported by a spine with good posture. This means you will experience a healthier, less painful body.  Correct posture must be practiced with all of your activities, especially prolonged sitting and standing. Correct posture is as important when doing repetitive low-stress activities (typing) as it is when doing a single heavy-load activity (lifting). PROLONGED  STANDING WHILE SLIGHTLY LEANING FORWARD When completing a task that requires you to lean forward while standing in one place for a long time, place either foot up on a stationary 2- to 4-inch high object to help maintain the best posture. When both feet are on the ground, the low back tends to lose its slight inward curve. If this curve flattens (or becomes too large), then the back and your other joints will experience too much stress, fatigue more quickly, and can cause pain.  RESTING POSITIONS Consider which positions are most painful for you when choosing a resting position. If you have pain with flexion-based activities (sitting, bending, stooping, squatting), choose a position that allows you to rest in a less flexed posture. You would want to avoid curling into a fetal position on your side. If your pain worsens with extension-based activities (prolonged standing, working overhead), avoid resting in an extended position such as sleeping on your stomach. Most people will find more comfort when they rest with their spine in a more neutral position, neither too rounded nor too arched. Lying on a non-sagging bed on your side with a pillow between your knees, or on your back with a pillow under your knees will often provide some relief. Keep in mind, being in any one position for a prolonged period of time, no matter how correct your posture, can still lead to stiffness. WALKING Walk with an upright posture. Your ears, shoulders, and hips should all line up. OFFICE WORK When working at a desk, create an environment that supports good, upright posture. Without extra support, muscles fatigue and lead to excessive strain on joints and other tissues. CHAIR:  A chair should be able to slide under your desk when your back makes contact with the back of the chair. This allows you to work closely.  The chair's height should allow your eyes to be level with the upper part of your monitor and your hands to be  slightly lower than your elbows.  Body position:  Your feet should make contact with the floor. If this is not possible, use a foot rest.  Keep your ears over your shoulders. This will reduce stress on your neck and low back.   This information is not intended to replace advice given to you by your health care provider. Make sure you discuss any questions you have with your health care provider.   Document Released: 09/06/2005 Document Revised: 09/27/2014 Document Reviewed: 12/19/2008 Elsevier Interactive Patient Education [employer].

## 2016-01-29 LAB — VITAMIN D 25 HYDROXY (VIT D DEFICIENCY, FRACTURES): VIT D 25 HYDROXY: 31 ng/mL (ref 30–100)

## 2016-03-26 ENCOUNTER — Ambulatory Visit (INDEPENDENT_AMBULATORY_CARE_PROVIDER_SITE_OTHER): Payer: BC Managed Care – PPO | Admitting: Physician Assistant

## 2016-03-26 ENCOUNTER — Encounter: Payer: Self-pay | Admitting: Physician Assistant

## 2016-03-26 VITALS — BP 107/41 | HR 65 | Ht 65.0 in | Wt 135.0 lb

## 2016-03-26 DIAGNOSIS — R5383 Other fatigue: Secondary | ICD-10-CM | POA: Diagnosis not present

## 2016-03-26 DIAGNOSIS — G2581 Restless legs syndrome: Secondary | ICD-10-CM | POA: Diagnosis not present

## 2016-03-26 DIAGNOSIS — M791 Myalgia, unspecified site: Secondary | ICD-10-CM

## 2016-03-26 DIAGNOSIS — M2550 Pain in unspecified joint: Secondary | ICD-10-CM

## 2016-03-26 LAB — CBC WITH DIFFERENTIAL/PLATELET
BASOS PCT: 0 %
Basophils Absolute: 0 cells/uL (ref 0–200)
EOS ABS: 546 {cells}/uL — AB (ref 15–500)
Eosinophils Relative: 7 %
HCT: 38.4 % (ref 35.0–45.0)
Hemoglobin: 12.7 g/dL (ref 11.7–15.5)
LYMPHS PCT: 30 %
Lymphs Abs: 2340 cells/uL (ref 850–3900)
MCH: 28.7 pg (ref 27.0–33.0)
MCHC: 33.1 g/dL (ref 32.0–36.0)
MCV: 86.9 fL (ref 80.0–100.0)
MONOS PCT: 7 %
MPV: 9.2 fL (ref 7.5–12.5)
Monocytes Absolute: 546 cells/uL (ref 200–950)
Neutro Abs: 4368 cells/uL (ref 1500–7800)
Neutrophils Relative %: 56 %
PLATELETS: 280 10*3/uL (ref 140–400)
RBC: 4.42 MIL/uL (ref 3.80–5.10)
RDW: 14 % (ref 11.0–15.0)
WBC: 7.8 10*3/uL (ref 3.8–10.8)

## 2016-03-26 MED ORDER — ROPINIROLE HCL 0.5 MG PO TABS
0.5000 mg | ORAL_TABLET | Freq: Every day | ORAL | Status: DC
Start: 2016-03-26 — End: 2016-09-14

## 2016-03-26 NOTE — Progress Notes (Signed)
   Subjective:    Patient ID: Heather Haas, female    DOB: 17-Mar-1976, 40 y.o.   MRN: [id number]  HPI  Pt is a 40 year old female who presents to the clinic with multiple joint pain, no energy, restless legs worsening, and hand pain for several months off and on. She is on her cycle today and feels more achy. No medications or supplements. Both shoulders and great right toe hurts. She reports off and on swelling of fingers. She denies any trauma. She does not exercise. She denies any feelings of hopelessness or helplessness.      Review of Systems  All other systems reviewed and are negative.  See HPI    Objective:   Physical Exam  Constitutional: She is oriented to person, place, and time. She appears well-developed and well-nourished.  HENT:  Head: Normocephalic and atraumatic.  Cardiovascular: Normal rate, regular rhythm and normal heart sounds.   Pulmonary/Chest: Effort normal and breath sounds normal.  Abdominal: Soft. Bowel sounds are normal. She exhibits no distension and no mass. There is no tenderness. There is no rebound and no guarding.  Musculoskeletal:  No tenderness to palpation, joint swelling, nodules to note over either hand.  Hand grip 5/5.    Neurological: She is alert and oriented to person, place, and time.  Psychiatric: She has a normal mood and affect. Her behavior is normal.          Assessment & Plan:  Myaglia/multiple joint pain/no energy- PHQ-2 was O. Will get TSH, b12, RF factors, cbc, ferritin. If labs normal could be variant of arthritis or look in to food sensitivity that could be causing this general sense of "not feeling good and achiness".   RLS- requip at bedtime. Gave handout discussed prevention.

## 2016-03-26 NOTE — Patient Instructions (Addendum)
D3 1000 units daily to increase vitamin D level.  Ferrous sulfate 360m daily to help with RLS.  Glucosamine chondrotin over the counter for joints.  requip at bedtime as needed.  Ibuprofen for joint pain as needed up to 8020m   Restless Legs Syndrome Restless legs syndrome is a condition that causes uncomfortable feelings or sensations in the legs, especially while sitting or lying down. The sensations usually cause an overwhelming urge to move the legs. The arms can also sometimes be affected. The condition can range from mild to severe. The symptoms often interfere with a person's ability to sleep. CAUSES The cause of this condition is not known. RISK FACTORS This condition is more likely to develop in:  People who are older than age 40 Pregnant women. In general, restless legs syndrome is more common in women than in men.  People who have a family history of the condition.  People who have certain medical conditions, such as iron deficiency, kidney disease, Parkinson disease, or nerve damage.  People who take certain medicines, such as medicines for high blood pressure, nausea, colds, allergies, depression, and some heart conditions. SYMPTOMS The main symptom of this condition is uncomfortable sensations in the legs. These sensations may be:  Described as pulling, tingling, prickling, throbbing, crawling, or burning.  Worse while you are sitting or lying down.  Worse during periods of rest or inactivity.  Worse at night, often interfering with your sleep.  Accompanied by a very strong urge to move your legs.  Temporarily relieved by movement of your legs. The sensations usually affect both sides of the body. The arms can also be affected, but this is rare. People who have this condition often have tiredness during the day because of their lack of sleep at night. DIAGNOSIS This condition may be diagnosed based on your description of the symptoms. You may also have tests,  including blood tests, to check for other conditions that may lead to your symptoms. In some cases, you may be asked to spend some time in a sleep lab so your sleeping can be monitored. TREATMENT Treatment for this condition is focused on managing the symptoms. Treatment may include:  Self-help and lifestyle changes.  Medicines. HOME CARE INSTRUCTIONS  Take medicines only as directed by your health care provider.  Try these methods to get temporary relief from the uncomfortable sensations:  Massage your legs.  Walk or stretch.  Take a cold or hot bath.  Practice good sleep habits. For example, go to bed and get up at the same time every day.  Exercise regularly.  Practice ways of relaxing, such as yoga or meditation.  Avoid caffeine and alcohol.  Do not use any tobacco products, including cigarettes, chewing tobacco, or electronic cigarettes. If you need help quitting, ask your health care provider.  Keep all follow-up visits as directed by your health care provider. This is important. SEEK MEDICAL CARE IF: Your symptoms do not improve with treatment, or they get worse.   This information is not intended to replace advice given to you by your health care provider. Make sure you discuss any questions you have with your health care provider.   Document Released: 08/27/2002 Document Revised: 01/21/2015 Document Reviewed: 09/02/2014 Elsevier Interactive Patient Education 20[employer]

## 2016-03-27 LAB — FERRITIN: Ferritin: 22 ng/mL (ref 10–232)

## 2016-03-27 LAB — RHEUMATOID FACTOR

## 2016-03-29 ENCOUNTER — Encounter: Payer: Self-pay | Admitting: Physician Assistant

## 2016-03-29 DIAGNOSIS — M791 Myalgia, unspecified site: Secondary | ICD-10-CM | POA: Insufficient documentation

## 2016-03-29 DIAGNOSIS — G2581 Restless legs syndrome: Secondary | ICD-10-CM | POA: Insufficient documentation

## 2016-03-29 DIAGNOSIS — D721 Eosinophilia, unspecified: Secondary | ICD-10-CM | POA: Insufficient documentation

## 2016-03-29 DIAGNOSIS — R5383 Other fatigue: Secondary | ICD-10-CM | POA: Insufficient documentation

## 2016-03-29 DIAGNOSIS — M2550 Pain in unspecified joint: Secondary | ICD-10-CM | POA: Insufficient documentation

## 2016-03-29 DIAGNOSIS — R790 Abnormal level of blood mineral: Secondary | ICD-10-CM | POA: Insufficient documentation

## 2016-03-29 LAB — CYCLIC CITRUL PEPTIDE ANTIBODY, IGG: Cyclic Citrullin Peptide Ab: <16 Units

## 2016-08-20 ENCOUNTER — Encounter: Payer: BC Managed Care – PPO | Admitting: Physician Assistant

## 2016-08-31 ENCOUNTER — Other Ambulatory Visit: Payer: Self-pay | Admitting: *Deleted

## 2016-08-31 MED ORDER — CLONAZEPAM 0.5 MG PO TABS: 0.5000 mg | ORAL_TABLET | Freq: Two times a day (BID) | ORAL | 0 refills | Status: DC

## 2016-09-14 ENCOUNTER — Encounter: Payer: Self-pay | Admitting: Physician Assistant

## 2016-09-14 ENCOUNTER — Ambulatory Visit (INDEPENDENT_AMBULATORY_CARE_PROVIDER_SITE_OTHER): Payer: BLUE CROSS/BLUE SHIELD | Admitting: Physician Assistant

## 2016-09-14 VITALS — BP 89/56 | HR 65 | Ht 65.0 in | Wt 138.0 lb

## 2016-09-14 DIAGNOSIS — F200 Paranoid schizophrenia: Secondary | ICD-10-CM | POA: Diagnosis not present

## 2016-09-14 DIAGNOSIS — R440 Auditory hallucinations: Secondary | ICD-10-CM | POA: Diagnosis not present

## 2016-09-14 MED ORDER — CLONAZEPAM 0.5 MG PO TABS: 0.5000 mg | ORAL_TABLET | Freq: Two times a day (BID) | ORAL | 1 refills | Status: DC | PRN

## 2016-09-14 MED ORDER — RISPERIDONE 1 MG PO TABS: 1.0000 mg | ORAL_TABLET | Freq: Every day | ORAL | 1 refills | Status: DC

## 2016-09-14 MED ORDER — SERTRALINE HCL 50 MG PO TABS: 50.0000 mg | ORAL_TABLET | Freq: Every day | ORAL | 1 refills | Status: DC

## 2016-09-14 NOTE — Patient Instructions (Signed)
[facility] app[facility].

## 2016-09-14 NOTE — Progress Notes (Signed)
   Subjective:    Patient ID: Heather Haas, female    DOB: 1975/10/29, 40 y.o.   MRN: [id number]  HPI  Pt is a 40 yo female who presents to the clinic for medication follow up after inpatient behavioral health for auditory hallucinations and paranoid thoughts. She reports hearing people swear at her and calling her "bad names". She would here noises in other room but when she got there not be one. She felt like even her family was talking about her.  [facility] and was admitted same day. She started treatment and released on 11/23. She has had no more hallucinations. She is feeling much better. She has not been back to work and looking for another job.   Review of Systems  All other systems reviewed and are negative.      Objective:   Physical Exam  Constitutional: She is oriented to person, place, and time. She appears well-developed and well-nourished.  HENT:  Head: Normocephalic and atraumatic.  Cardiovascular: Normal rate, regular rhythm and normal heart sounds.   Pulmonary/Chest: Effort normal and breath sounds normal.  Neurological: She is alert and oriented to person, place, and time.  Psychiatric: She has a normal mood and affect. Her behavior is normal.          Assessment & Plan:  .[name] was seen today for depression and anxiety.  Diagnoses and all orders for this visit:  Paranoid schizophrenia ([place]) -     risperiDONE (RISPERDAL) 1 MG tablet; Take 1 tablet (1 mg total) by mouth at bedtime. -     sertraline (ZOLOFT) 50 MG tablet; Take 1 tablet (50 mg total) by mouth daily. -     clonazePAM (KLONOPIN) 0.5 MG tablet; Take 1 tablet (0.5 mg total) by mouth 2 (two) times daily as needed for anxiety.  Auditory hallucinations -     risperiDONE (RISPERDAL) 1 MG tablet; Take 1 tablet (1 mg total) by mouth at bedtime. -     sertraline (ZOLOFT) 50 MG tablet; Take 1 tablet (50 mg total) by mouth daily. -     clonazePAM (KLONOPIN) 0.5 MG tablet; Take 1 tablet (0.5 mg  total) by mouth 2 (two) times daily as needed for anxiety.   Sent refills to get patient until [facility] app[facility].  Symptoms are controlled today.

## 2016-09-21 ENCOUNTER — Ambulatory Visit: Payer: BC Managed Care – PPO | Admitting: Physician Assistant

## 2017-06-28 ENCOUNTER — Encounter: Payer: Self-pay | Admitting: Physician Assistant

## 2017-06-28 ENCOUNTER — Ambulatory Visit (INDEPENDENT_AMBULATORY_CARE_PROVIDER_SITE_OTHER): Payer: Self-pay | Admitting: Physician Assistant

## 2017-06-28 VITALS — BP 97/50 | HR 67 | Ht 65.0 in | Wt 136.0 lb

## 2017-06-28 DIAGNOSIS — F200 Paranoid schizophrenia: Secondary | ICD-10-CM

## 2017-06-28 DIAGNOSIS — R440 Auditory hallucinations: Secondary | ICD-10-CM

## 2017-06-28 DIAGNOSIS — Z13220 Encounter for screening for lipoid disorders: Secondary | ICD-10-CM

## 2017-06-28 DIAGNOSIS — R202 Paresthesia of skin: Secondary | ICD-10-CM

## 2017-06-28 DIAGNOSIS — Z131 Encounter for screening for diabetes mellitus: Secondary | ICD-10-CM

## 2017-06-28 DIAGNOSIS — R5383 Other fatigue: Secondary | ICD-10-CM

## 2017-06-28 MED ORDER — RISPERIDONE 1 MG PO TABS: 1.0000 mg | ORAL_TABLET | Freq: Every day | ORAL | 1 refills | Status: DC

## 2017-06-28 MED ORDER — CLONAZEPAM 0.5 MG PO TABS: 0.5000 mg | ORAL_TABLET | Freq: Two times a day (BID) | ORAL | 1 refills | Status: DC | PRN

## 2017-06-28 MED ORDER — SERTRALINE HCL 50 MG PO TABS: 50.0000 mg | ORAL_TABLET | Freq: Every day | ORAL | 3 refills | Status: DC

## 2017-06-28 NOTE — Patient Instructions (Signed)
b12 1000 mcg and vitamin d 1000 units.

## 2017-06-28 NOTE — Progress Notes (Signed)
Subjective:    Patient ID: Heather Haas, female    DOB: 1976/07/31, 41 y.o.   MRN: [id number]  HPI  Pt is a 41 yo female who presents to the clinic for 6 month follow up on medications.   paranoid schizophrenia- patient is doing well. She denies any auditory or visual hallucinations. Her mood is well controlled. She did run out of Risperdal this weekend and had some intermittent tingling of extremities. She does occasional take klonapin 60 have lasted from December 2017.  She is trying to stay active. She occasionally feels fatigued in afternoon.   .. Active Ambulatory Problems    Diagnosis Date Noted  . 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 Cervical spondylosis 01/02/2014  . Bilateral hand numbness 01/04/2014  . Dermatitis 07/08/2015  . Lesion of palate 01/28/2016  . Eosinophilia 03/29/2016  . Low iron stores 03/29/2016  . No energy 03/29/2016  . Myalgia 03/29/2016  . Multiple joint pain 03/29/2016  . RLS (restless legs syndrome) 03/29/2016  . Paranoid schizophrenia ([place]) 09/14/2016  . Auditory hallucinations 09/14/2016  . Tingling of skin 06/28/2017   Resolved Ambulatory Problems    Diagnosis Date Noted  . No Resolved Ambulatory Problems   No Additional Past Medical History   .Marland Kitchen Family History  Problem Relation Age of Onset  . Alcohol abuse Father   . Alcohol abuse Brother   . Cancer Maternal Aunt   . Diabetes Maternal Uncle   . Stroke Maternal Grandmother   . Diabetes Paternal Grandmother        Review of Systems  All other systems reviewed and are negative.      Objective:   Physical Exam  Constitutional: She is oriented to person, place, and time. She appears well-developed and well-nourished.  HENT:  Head: Normocephalic and atraumatic.  Cardiovascular: Normal rate, regular rhythm and normal heart sounds.   Pulmonary/Chest: Effort normal and breath sounds normal.  Neurological: She is alert and oriented to person, place, and time.  Psychiatric: She has  a normal mood and affect. Her behavior is normal.          Assessment & Plan:  Marland KitchenMarland KitchenDiagnoses and all orders for this visit:  Paranoid schizophrenia ([place]) -     risperiDONE (RISPERDAL) 1 MG tablet; Take 1 tablet (1 mg total) by mouth at bedtime. -     sertraline (ZOLOFT) 50 MG tablet; Take 1 tablet (50 mg total) by mouth daily. -     clonazePAM (KLONOPIN) 0.5 MG tablet; Take 1 tablet (0.5 mg total) by mouth 2 (two) times daily as needed for anxiety.  Auditory hallucinations -     risperiDONE (RISPERDAL) 1 MG tablet; Take 1 tablet (1 mg total) by mouth at bedtime. -     sertraline (ZOLOFT) 50 MG tablet; Take 1 tablet (50 mg total) by mouth daily. -     clonazePAM (KLONOPIN) 0.5 MG tablet; Take 1 tablet (0.5 mg total) by mouth 2 (two) times daily as needed for anxiety.  Screening for diabetes mellitus -     COMPLETE METABOLIC PANEL WITH GFR  Screening for lipid disorders -     Lipid Panel w/reflex Direct LDL  Tingling of skin  No energy   .Marland Kitchen Depression screen PHQ 2/9 06/28/2017  Decreased Interest 0  Down, Depressed, Hopeless 0  PHQ - 2 Score 0   Refilled medications for 1year since doing so well.  Ordered CMP to follow up on liver.   Reassured patient I feel like intermittent tingling of extremities  like due to stopping her psych medications all of a sudden when ran out this weekend.   Needs fasting labs drawn.   Discussed good sleep and vitamin D and B12 to improve energy.

## 2017-09-27 ENCOUNTER — Other Ambulatory Visit: Payer: Self-pay | Admitting: *Deleted

## 2017-09-27 DIAGNOSIS — R440 Auditory hallucinations: Secondary | ICD-10-CM

## 2017-09-27 DIAGNOSIS — F200 Paranoid schizophrenia: Secondary | ICD-10-CM

## 2017-09-27 MED ORDER — RISPERIDONE 1 MG PO TABS: 1.0000 mg | ORAL_TABLET | Freq: Every day | ORAL | 1 refills | Status: DC

## 2018-05-04 ENCOUNTER — Other Ambulatory Visit: Payer: Self-pay

## 2018-05-04 DIAGNOSIS — Z131 Encounter for screening for diabetes mellitus: Secondary | ICD-10-CM

## 2018-05-04 DIAGNOSIS — Z0000 Encounter for general adult medical examination without abnormal findings: Secondary | ICD-10-CM

## 2018-09-23 ENCOUNTER — Other Ambulatory Visit: Payer: Self-pay | Admitting: Physician Assistant

## 2018-09-23 DIAGNOSIS — R440 Auditory hallucinations: Secondary | ICD-10-CM

## 2018-09-23 DIAGNOSIS — F200 Paranoid schizophrenia: Secondary | ICD-10-CM

## 2018-10-09 ENCOUNTER — Other Ambulatory Visit: Payer: Self-pay | Admitting: Physician Assistant

## 2018-10-09 DIAGNOSIS — F200 Paranoid schizophrenia: Secondary | ICD-10-CM

## 2018-10-09 DIAGNOSIS — R440 Auditory hallucinations: Secondary | ICD-10-CM

## 2018-10-09 NOTE — Telephone Encounter (Signed)
Called pharmacy, they did get Rx

## 2018-12-26 ENCOUNTER — Other Ambulatory Visit: Payer: Self-pay | Admitting: Physician Assistant

## 2018-12-26 DIAGNOSIS — R440 Auditory hallucinations: Secondary | ICD-10-CM

## 2018-12-26 DIAGNOSIS — F200 Paranoid schizophrenia: Secondary | ICD-10-CM

## 2018-12-30 ENCOUNTER — Other Ambulatory Visit: Payer: Self-pay | Admitting: Physician Assistant

## 2018-12-30 DIAGNOSIS — F200 Paranoid schizophrenia: Secondary | ICD-10-CM

## 2018-12-30 DIAGNOSIS — R440 Auditory hallucinations: Secondary | ICD-10-CM

## 2019-01-01 NOTE — Telephone Encounter (Signed)
Called, was connected to spouses voicemail. VM was full, unable to leave msg

## 2019-01-01 NOTE — Telephone Encounter (Signed)
If willing in person. If not I will do virtual to refill through pandemic.

## 2019-01-01 NOTE — Telephone Encounter (Signed)
Last appt was 06-28-17.Marland Kitchen ok for virtual/phone visit or would you like pt to come into office to be seen?

## 2019-01-02 NOTE — Telephone Encounter (Signed)
Attempted to call again, no ans and VM full

## 2019-01-27 ENCOUNTER — Other Ambulatory Visit: Payer: Self-pay | Admitting: Physician Assistant

## 2019-01-27 DIAGNOSIS — F200 Paranoid schizophrenia: Secondary | ICD-10-CM

## 2019-01-27 DIAGNOSIS — R440 Auditory hallucinations: Secondary | ICD-10-CM

## 2019-03-21 ENCOUNTER — Ambulatory Visit (INDEPENDENT_AMBULATORY_CARE_PROVIDER_SITE_OTHER): Payer: BC Managed Care – PPO

## 2019-03-21 ENCOUNTER — Encounter: Payer: Self-pay | Admitting: Physician Assistant

## 2019-03-21 ENCOUNTER — Other Ambulatory Visit: Payer: Self-pay

## 2019-03-21 ENCOUNTER — Ambulatory Visit (INDEPENDENT_AMBULATORY_CARE_PROVIDER_SITE_OTHER): Payer: BC Managed Care – PPO | Admitting: Physician Assistant

## 2019-03-21 VITALS — BP 102/52 | HR 54 | Temp 98.7°F | Ht 64.0 in | Wt 135.0 lb

## 2019-03-21 DIAGNOSIS — Z131 Encounter for screening for diabetes mellitus: Secondary | ICD-10-CM

## 2019-03-21 DIAGNOSIS — D239 Other benign neoplasm of skin, unspecified: Secondary | ICD-10-CM

## 2019-03-21 DIAGNOSIS — F200 Paranoid schizophrenia: Secondary | ICD-10-CM

## 2019-03-21 DIAGNOSIS — I959 Hypotension, unspecified: Secondary | ICD-10-CM

## 2019-03-21 DIAGNOSIS — Z23 Encounter for immunization: Secondary | ICD-10-CM

## 2019-03-21 DIAGNOSIS — Z1231 Encounter for screening mammogram for malignant neoplasm of breast: Secondary | ICD-10-CM

## 2019-03-21 DIAGNOSIS — R42 Dizziness and giddiness: Secondary | ICD-10-CM

## 2019-03-21 DIAGNOSIS — Z13220 Encounter for screening for lipoid disorders: Secondary | ICD-10-CM

## 2019-03-21 DIAGNOSIS — R440 Auditory hallucinations: Secondary | ICD-10-CM

## 2019-03-21 MED ORDER — SERTRALINE HCL 50 MG PO TABS: 50.0000 mg | ORAL_TABLET | Freq: Every day | ORAL | 4 refills | Status: DC

## 2019-03-21 MED ORDER — RISPERIDONE 1 MG PO TABS: 1.0000 mg | ORAL_TABLET | Freq: Every day | ORAL | 4 refills | Status: DC

## 2019-03-21 NOTE — Patient Instructions (Addendum)
Will make referral for dermatologist to look at tongue nevus.   Get labs.     Hypotension As your heart beats, it forces blood through your body. Hypotension, commonly called low blood pressure, is when the force of blood pumping through your arteries is too weak. Arteries are blood vessels that carry blood from the heart throughout the body. Depending on the cause and severity, hypotension may be harmless (benign) or may cause serious problems (be critical). When blood pressure is too low, you may not get enough blood to your brain or to the rest of your organs. This can cause weakness, light-headedness, rapid heartbeat, and fainting. What are the causes? This condition may be caused by:  Blood loss.  Loss of body fluids (dehydration).  Heart problems.  Hormone (endocrine) problems.  Pregnancy.  Severe infection.  Lack of certain nutrients.  Severe allergic reactions (anaphylaxis).  Certain medicines, such as blood pressure medicine or medicines that make the body lose excess fluids (diuretics). Sometimes, hypotension may be caused by not taking medicine as directed, such as taking too much of a certain medicine. What increases the risk? The following factors may make you more likely to develop this condition:  Age. Risk increases as you get older.  Conditions that affect the heart or the central nervous system.  Taking certain medicines, such as blood pressure medicine or diuretics.  Being pregnant. What are the signs or symptoms? Common symptoms of this condition include:  Weakness.  Light-headedness.  Dizziness.  Blurred vision.  Fatigue.  Rapid heartbeat.  Fainting, in severe cases. How is this diagnosed? This condition is diagnosed based on:  Your medical history.  Your symptoms.  Your blood pressure measurement. Your health care provider will check your blood pressure when you are: ? Lying down. ? Sitting. ? Standing. A blood pressure reading  is recorded as two numbers, such as "120 over 80" (or 120/80). The first ("top") number is called the systolic pressure. It is a measure of the pressure in your arteries as your heart beats. The second ("bottom") number is called the diastolic pressure. It is a measure of the pressure in your arteries when your heart relaxes between beats. Blood pressure is measured in a unit called mm Hg. Healthy blood pressure for most adults is 120/80. If your blood pressure is below 90/60, you may be diagnosed with hypotension. Other information or tests that may be used to diagnose hypotension include:  Your other vital signs, such as your heart rate and temperature.  Blood tests.  Tilt table test. For this test, you will be safely secured to a table that moves you from a lying position to an upright position. Your heart rhythm and blood pressure will be monitored during the test. How is this treated? Treatment for this condition may include:  Changing your diet. This may involve eating more salt (sodium) or drinking more water.  Taking medicines to raise your blood pressure.  Changing the dosage of certain medicines you are taking that might be lowering your blood pressure.  Wearing compression stockings. These stockings help to prevent blood clots and reduce swelling in your legs. In some cases, you may need to go to the hospital for:  Fluid replacement. This means you will receive fluids through an IV.  Blood replacement. This means you will receive donated blood through an IV (transfusion).  Treating an infection or heart problems, if this applies.  Monitoring. You may need to be monitored while medicines that you are taking  wear off. Follow these instructions at home: Eating and drinking   Drink enough fluid to keep your urine pale yellow.  Eat a healthy diet, and follow instructions from your health care provider about eating or drinking restrictions. A healthy diet includes: ? Fresh  fruits and vegetables. ? Whole grains. ? Lean meats. ? Low-fat dairy products.  Eat extra salt only as directed. Do not add extra salt to your diet unless your health care provider told you to do that.  Eat frequent, small meals.  Avoid standing up suddenly after eating. Medicines  Take over-the-counter and prescription medicines only as told by your health care provider. ? Follow instructions from your health care provider about changing the dosage of your current medicines, if this applies. ? Do not stop or adjust any of your medicines on your own. General instructions   Wear compression stockings as told by your health care provider.  Get up slowly from lying down or sitting positions. This gives your blood pressure a chance to adjust.  Avoid hot showers and excessive heat as directed by your health care provider.  Return to your normal activities as told by your health care provider. Ask your health care provider what activities are safe for you.  Do not use any products that contain nicotine or tobacco, such as cigarettes, e-cigarettes, and chewing tobacco. If you need help quitting, ask your health care provider.  Keep all follow-up visits as told by your health care provider. This is important. Contact a health care provider if you:  Vomit.  Have diarrhea.  Have a fever for more than 2-3 days.  Feel more thirsty than usual.  Feel weak and tired. Get help right away if you:  Have chest pain.  Have a fast or irregular heartbeat.  Develop numbness in any part of your body.  Cannot move your arms or your legs.  Have trouble speaking.  Become sweaty or feel light-headed.  Faint.  Feel short of breath.  Have trouble staying awake.  Feel confused. Summary  Hypotension is when the force of blood pumping through your arteries is too weak.  Hypotension may be harmless (benign) or may cause serious problems (be critical).  Treatment for this condition  may include changing your diet, changing your medicines, and wearing compression stockings.  In some cases, you may need to go to the hospital for fluid or blood replacement. This information is not intended to replace advice given to you by your health care provider. Make sure you discuss any questions you have with your health care provider. Document Released: 09/06/2005 Document Revised: 03/02/2018 Document Reviewed: 03/02/2018 Elsevier Patient Education  2020 [employer].

## 2019-03-21 NOTE — Progress Notes (Signed)
mb  Subjective:    Patient ID: Heather Haas, female    DOB: June 03, 1976, 43 y.o.   MRN: [id number]  HPI  Pt is a 43 yo female with hx of auditory hallucinations and paranoid schizophrenia who presents to the clinic for medication refills.   She is doing well. No problems or concerns with her mood. She is stable on medications.   She did notice a blue spot on her tongue last week. No pain associated. No injury.   She has felt a little dizzy when standing a few times. Never passed out or LOC.   .. Active Ambulatory Problems    Diagnosis Date Noted  . 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 Cervical spondylosis 01/02/2014  . Bilateral hand numbness 01/04/2014  . Dermatitis 07/08/2015  . Lesion of palate 01/28/2016  . Eosinophilia 03/29/2016  . Low iron stores 03/29/2016  . No energy 03/29/2016  . Myalgia 03/29/2016  . Multiple joint pain 03/29/2016  . RLS (restless legs syndrome) 03/29/2016  . Paranoid schizophrenia ([place]) 09/14/2016  . Auditory hallucinations 09/14/2016  . Tingling of skin 06/28/2017  . Blue nevus 03/21/2019  . Dizziness 03/21/2019  . Hypotension 03/21/2019   Resolved Ambulatory Problems    Diagnosis Date Noted  . No Resolved Ambulatory Problems   No Additional Past Medical History     Review of Systems  All other systems reviewed and are negative.      Objective:   Physical Exam Vitals signs reviewed.  Constitutional:      Appearance: Normal appearance.  HENT:     Mouth/Throat:     Comments: Left lateral tongue well defined slightly raised blue mass pea-sized.  Cardiovascular:     Rate and Rhythm: Normal rate.  Pulmonary:     Effort: Pulmonary effort is normal.  Neurological:     General: No focal deficit present.     Mental Status: She is alert and oriented to person, place, and time.  Psychiatric:        Mood and Affect: Mood normal.           Assessment & Plan:  .[name] was seen today for nevus.  Diagnoses and all orders for this  visit:  Blue nevus -     Ambulatory referral to Dermatology  Paranoid schizophrenia ([place]) -     sertraline (ZOLOFT) 50 MG tablet; Take 1 tablet (50 mg total) by mouth daily. -     risperiDONE (RISPERDAL) 1 MG tablet; Take 1 tablet (1 mg total) by mouth at bedtime.  Auditory hallucinations -     sertraline (ZOLOFT) 50 MG tablet; Take 1 tablet (50 mg total) by mouth daily. -     risperiDONE (RISPERDAL) 1 MG tablet; Take 1 tablet (1 mg total) by mouth at bedtime.  Visit for screening mammogram -     MM 3D SCREEN BREAST BILATERAL  Screening for lipid disorders -     Lipid Panel w/reflex Direct LDL  Screening for diabetes mellitus -     COMPLETE METABOLIC PANEL WITH GFR  Dizziness -     CBC with Differential/Platelet -     Ferritin -     TSH  Need for tetanus booster -     Tdap vaccine greater than or equal to 7yo IM  Hypotension, unspecified hypotension type  tongue mass appears like a blue nevus or varicose vein. I would like a second opinion and to see if they would want to biopsy since noticed so fast. Referral placed.  Marland KitchenMarland Kitchen  Depression screen [facility] 2/9 03/21/2019 06/28/2017  Decreased Interest 0 0  Down, Depressed, Hopeless 0 0  PHQ - 2 Score 0 0   Refilled medications.   Needs screening labs.   Discussed low blood pressure. Orthostatic readings were normal. Make sure to stay hydrated and do not salt restrict. Follow up as needed.

## 2019-03-22 LAB — TSH: TSH: 2.91 mIU/L

## 2019-03-22 LAB — COMPLETE METABOLIC PANEL WITH GFR
AG Ratio: 1.9 (calc) (ref 1.0–2.5)
ALT: 16 U/L (ref 6–29)
AST: 19 U/L (ref 10–30)
Albumin: 4.5 g/dL (ref 3.6–5.1)
Alkaline phosphatase (APISO): 44 U/L (ref 31–125)
BUN: 9 mg/dL (ref 7–25)
CO2: 26 mmol/L (ref 20–32)
Calcium: 9.3 mg/dL (ref 8.6–10.2)
Chloride: 105 mmol/L (ref 98–110)
Creat: 0.89 mg/dL (ref 0.50–1.10)
GFR, Est African American: 92 mL/min/{1.73_m2} (ref >=60)
GFR, Est Non African American: 79 mL/min/{1.73_m2} (ref >=60)
Globulin: 2.4 g/dL (calc) (ref 1.9–3.7)
Glucose, Bld: 96 mg/dL (ref 65–99)
Potassium: 4.3 mmol/L (ref 3.5–5.3)
Sodium: 138 mmol/L (ref 135–146)
Total Bilirubin: 0.8 mg/dL (ref 0.2–1.2)
Total Protein: 6.9 g/dL (ref 6.1–8.1)

## 2019-03-22 LAB — CBC WITH DIFFERENTIAL/PLATELET
Absolute Monocytes: 422 cells/uL (ref 200–950)
Basophils Absolute: 41 cells/uL (ref 0–200)
Basophils Relative: 0.6 %
Eosinophils Absolute: 564 cells/uL — ABNORMAL HIGH (ref 15–500)
Eosinophils Relative: 8.3 %
HCT: 39.8 % (ref 35.0–45.0)
Hemoglobin: 13.4 g/dL (ref 11.7–15.5)
Lymphs Abs: 2047 cells/uL (ref 850–3900)
MCH: 29.7 pg (ref 27.0–33.0)
MCHC: 33.7 g/dL (ref 32.0–36.0)
MCV: 88.2 fL (ref 80.0–100.0)
MPV: 10.4 fL (ref 7.5–12.5)
Monocytes Relative: 6.2 %
Neutro Abs: 3726 cells/uL (ref 1500–7800)
Neutrophils Relative %: 54.8 %
Platelets: 276 10*3/uL (ref 140–400)
RBC: 4.51 10*6/uL (ref 3.80–5.10)
RDW: 12.7 % (ref 11.0–15.0)
Total Lymphocyte: 30.1 %
WBC: 6.8 10*3/uL (ref 3.8–10.8)

## 2019-03-22 LAB — FERRITIN: Ferritin: 67 ng/mL (ref 16–232)

## 2019-03-22 LAB — LIPID PANEL W/REFLEX DIRECT LDL
Cholesterol: 167 mg/dL (ref <200)
HDL: 68 mg/dL (ref >=50)
LDL Cholesterol (Calc): 86 mg/dL (calc)
Non-HDL Cholesterol (Calc): 99 mg/dL (calc) (ref <130)
Total CHOL/HDL Ratio: 2.5 (calc) (ref <5.0)
Triglycerides: 54 mg/dL (ref <150)

## 2019-03-23 NOTE — Progress Notes (Signed)
Call pt: cholesterol looks great. Kidney, liver, glucose looks great. WBC great. Not anemic.Eosinophils elevated. This is a WBC increase due to allergic response. Are your allergies bad right now? Thyroid looks great.

## 2019-03-23 NOTE — Progress Notes (Signed)
Normal mammogram. Follow up in 1 year.

## 2019-04-11 ENCOUNTER — Telehealth: Payer: Self-pay | Admitting: Neurology

## 2019-04-11 DIAGNOSIS — R7989 Other specified abnormal findings of blood chemistry: Secondary | ICD-10-CM

## 2019-04-11 NOTE — Telephone Encounter (Signed)
Patient called and is worried about her lab work from 03/21/2019. She states she wasn't having allergy issues but on Monday and Tuesday she had a headache and some nausea. She feels okay today. She is nervous about what this means. I tried to explain the allergic response lab to her, but I don't think she understands. She wants testing for anything she could possibly be allergic to. Please advise on how to help patient.

## 2019-04-12 NOTE — Telephone Encounter (Signed)
Patient made aware. She is agreeable to reck CBC in a few weeks. Order entered.

## 2019-04-12 NOTE — Telephone Encounter (Signed)
Eosinophils are WBC that increase in times of allergies. This can be just seasonal allergies or any inflammation. To find what exactly you are allergic too could be hard. If you are not having runny nose, cough, headaches, trouble breathing, wheezing, sinus congestion I would not go down the investigation pathway right now. I do think we could check your cbc with diff in 4 weeks and see if it has resolved or worsened. Does this help.

## 2019-04-24 LAB — CBC WITH DIFFERENTIAL/PLATELET
Absolute Monocytes: 329 cells/uL (ref 200–950)
Basophils Absolute: 42 cells/uL (ref 0–200)
Basophils Relative: 0.8 %
Eosinophils Absolute: 588 cells/uL — ABNORMAL HIGH (ref 15–500)
Eosinophils Relative: 11.1 %
HCT: 40.9 % (ref 35.0–45.0)
Hemoglobin: 13.6 g/dL (ref 11.7–15.5)
Lymphs Abs: 1807 cells/uL (ref 850–3900)
MCH: 30 pg (ref 27.0–33.0)
MCHC: 33.3 g/dL (ref 32.0–36.0)
MCV: 90.3 fL (ref 80.0–100.0)
MPV: 10.2 fL (ref 7.5–12.5)
Monocytes Relative: 6.2 %
Neutro Abs: 2533 cells/uL (ref 1500–7800)
Neutrophils Relative %: 47.8 %
Platelets: 297 10*3/uL (ref 140–400)
RBC: 4.53 10*6/uL (ref 3.80–5.10)
RDW: 12.8 % (ref 11.0–15.0)
Total Lymphocyte: 34.1 %
WBC: 5.3 10*3/uL (ref 3.8–10.8)

## 2019-04-24 NOTE — Telephone Encounter (Signed)
Just barely elevated but still present.   Any GI symptoms? More fatigue lately? We could do labs for adrenal insuffiencey?

## 2020-02-15 ENCOUNTER — Ambulatory Visit (INDEPENDENT_AMBULATORY_CARE_PROVIDER_SITE_OTHER): Payer: BC Managed Care – PPO | Admitting: Medical-Surgical

## 2020-02-15 ENCOUNTER — Encounter: Payer: Self-pay | Admitting: Medical-Surgical

## 2020-02-15 VITALS — BP 98/62 | HR 61 | Temp 98.3°F | Ht 64.0 in | Wt 148.5 lb

## 2020-02-15 DIAGNOSIS — R21 Rash and other nonspecific skin eruption: Secondary | ICD-10-CM

## 2020-02-15 DIAGNOSIS — W57XXXA Bitten or stung by nonvenomous insect and other nonvenomous arthropods, initial encounter: Secondary | ICD-10-CM | POA: Diagnosis not present

## 2020-02-15 MED ORDER — DOXYCYCLINE HYCLATE 100 MG PO TABS: 100.0000 mg | ORAL_TABLET | Freq: Two times a day (BID) | ORAL | 0 refills | Status: AC

## 2020-02-15 NOTE — Progress Notes (Signed)
Subjective:    CC: tick bite  HPI: Very pleasant 44 year old female presenting today with reports of a tick bite 2 weeks ago.  She the tick was small, not engorged.  At best guess she estimates that the tick that bit her was attached for approximately 24 hours but is not sure.  Couple of days after the tick bite she noticed a rash surrounding the bite that was very itchy.  Yesterday on awakening she noticed generalized body aches and fatigue.  She also experienced short-lived severe back pain yesterday.  Her symptoms have resolved and she reports feeling fine today.  Wanted to be evaluated to make sure she is okay after her tick bite.  She endorses living in an area with tall grasses and being bitten several times a year by ticks.  She has never had a rash with any of her bites.  She has not taken any medications to treat her rash, body aches, or fatigue.  I reviewed the past medical history, family history, social history, surgical history, and allergies today and no changes were needed.  Please see the problem list section below in epic for further details.  Past Medical History: History reviewed. No pertinent past medical history. Past Surgical History: Past Surgical History:  Procedure Laterality Date  . GYNECOLOGIC CRYOSURGERY     Social History: Social History   Socioeconomic History  . Marital status: Married    Spouse name: Not on file  . Number of children: Not on file  . Years of education: Not on file  . Highest education level: Not on file  Occupational History  . Not on file  Tobacco Use  . Smoking status: Never Smoker  . Smokeless tobacco: Never Used  Substance and Sexual Activity  . Alcohol use: No  . Drug use: No  . Sexual activity: Not on file  Other Topics Concern  . Not on file  Social History Narrative  . Not on file   Social Determinants of Health   Financial Resource Strain:   . Difficulty of Paying Living Expenses:   Food Insecurity:   . Worried  About Charity fundraiser in the Last Year:   . Arboriculturist in the Last Year:   Transportation Needs:   . Film/video editor (Medical):   Marland Kitchen Lack of Transportation (Non-Medical):   Physical Activity:   . Days of Exercise per Week:   . Minutes of Exercise per Session:   Stress:   . Feeling of Stress :   Social Connections:   . Frequency of Communication with Friends and Family:   . Frequency of Social Gatherings with Friends and Family:   . Attends Religious Services:   . Active Member of Clubs or Organizations:   . Attends Archivist Meetings:   Marland Kitchen Marital Status:    Family History: Family History  Problem Relation Age of Onset  . Alcohol abuse Father   . Alcohol abuse Brother   . Cancer Maternal Aunt   . Diabetes Maternal Uncle   . Stroke Maternal Grandmother   . Diabetes Paternal Grandmother    Allergies: Allergies  Allergen Reactions  . Diflucan [Fluconazole]     RASH.   Marland Kitchen Penicillins    Medications: See med rec.  Review of Systems: No fevers, chills, night sweats, weight loss, chest pain, or shortness of breath.   Objective:    General: Well Developed, well nourished, and in no acute distress.  Neuro: Alert and oriented x3.  HEENT: Normocephalic, atraumatic.  Skin: Warm and dry. Small scabbed bite to the posterior arm near the axillary crease with an area of flat, darker discoloration surrounding. No definitive bullseye rash noted. Cardiac: Regular rate and rhythm, no murmurs rubs or gallops, no lower extremity edema.  Respiratory: Clear to auscultation bilaterally. Not using accessory muscles, speaking in full sentences.   Impression and Recommendations:    1. Tick bite, initial encounter With presence of rash and body aches/fatigue yesterday, concerning for Lyme. Checking Western blot. Will go ahead and treat with doxycycline 151m BID x 10 days. - B. burgdorfi antibodies by WB - doxycycline (VIBRA-TABS) 100 MG tablet; Take 1 tablet (100 mg  total) by mouth 2 (two) times daily for 10 days.  Dispense: 20 tablet; Refill: 0  Return if symptoms worsen or fail to improve. ___________________________________________ JClearnce Sorrel DNP, APRN, FNP-BC [facility]

## 2020-02-19 LAB — B. BURGDORFI ANTIBODIES BY WB
B burgdorferi IgG Abs (IB): NEGATIVE
B burgdorferi IgM Abs (IB): NEGATIVE
Lyme Disease 18 kD IgG: NONREACTIVE
Lyme Disease 23 kD IgG: NONREACTIVE
Lyme Disease 23 kD IgM: NONREACTIVE
Lyme Disease 28 kD IgG: NONREACTIVE
Lyme Disease 30 kD IgG: NONREACTIVE
Lyme Disease 39 kD IgG: NONREACTIVE
Lyme Disease 39 kD IgM: REACTIVE — AB
Lyme Disease 41 kD IgG: REACTIVE — AB
Lyme Disease 41 kD IgM: NONREACTIVE
Lyme Disease 45 kD IgG: NONREACTIVE
Lyme Disease 58 kD IgG: REACTIVE — AB
Lyme Disease 66 kD IgG: NONREACTIVE
Lyme Disease 93 kD IgG: NONREACTIVE

## 2020-06-11 ENCOUNTER — Other Ambulatory Visit: Payer: Self-pay | Admitting: Physician Assistant

## 2020-06-11 DIAGNOSIS — F200 Paranoid schizophrenia: Secondary | ICD-10-CM

## 2020-06-11 DIAGNOSIS — R440 Auditory hallucinations: Secondary | ICD-10-CM

## 2020-09-07 ENCOUNTER — Other Ambulatory Visit: Payer: Self-pay | Admitting: Physician Assistant

## 2020-09-07 DIAGNOSIS — F200 Paranoid schizophrenia: Secondary | ICD-10-CM

## 2020-09-07 DIAGNOSIS — R440 Auditory hallucinations: Secondary | ICD-10-CM

## 2020-11-11 ENCOUNTER — Ambulatory Visit: Payer: BC Managed Care – PPO | Admitting: Physician Assistant

## 2020-11-18 ENCOUNTER — Ambulatory Visit: Payer: BC Managed Care – PPO | Admitting: Physician Assistant

## 2020-11-19 ENCOUNTER — Ambulatory Visit (INDEPENDENT_AMBULATORY_CARE_PROVIDER_SITE_OTHER): Payer: BC Managed Care – PPO | Admitting: Physician Assistant

## 2020-11-19 DIAGNOSIS — Z5329 Procedure and treatment not carried out because of patient's decision for other reasons: Secondary | ICD-10-CM

## 2020-11-19 NOTE — Progress Notes (Signed)
No show

## 2021-03-04 ENCOUNTER — Telehealth: Payer: Self-pay | Admitting: Neurology

## 2021-03-04 ENCOUNTER — Other Ambulatory Visit: Payer: Self-pay

## 2021-03-04 ENCOUNTER — Encounter: Payer: Self-pay | Admitting: Physician Assistant

## 2021-03-04 ENCOUNTER — Ambulatory Visit (INDEPENDENT_AMBULATORY_CARE_PROVIDER_SITE_OTHER): Payer: BC Managed Care – PPO | Admitting: Physician Assistant

## 2021-03-04 VITALS — BP 95/50 | HR 60 | Ht 64.0 in | Wt 148.0 lb

## 2021-03-04 DIAGNOSIS — R1030 Lower abdominal pain, unspecified: Secondary | ICD-10-CM | POA: Insufficient documentation

## 2021-03-04 DIAGNOSIS — Z1159 Encounter for screening for other viral diseases: Secondary | ICD-10-CM

## 2021-03-04 DIAGNOSIS — Z13220 Encounter for screening for lipoid disorders: Secondary | ICD-10-CM

## 2021-03-04 DIAGNOSIS — Z1211 Encounter for screening for malignant neoplasm of colon: Secondary | ICD-10-CM

## 2021-03-04 DIAGNOSIS — Z79899 Other long term (current) drug therapy: Secondary | ICD-10-CM

## 2021-03-04 DIAGNOSIS — R1031 Right lower quadrant pain: Secondary | ICD-10-CM

## 2021-03-04 DIAGNOSIS — L309 Dermatitis, unspecified: Secondary | ICD-10-CM

## 2021-03-04 DIAGNOSIS — F200 Paranoid schizophrenia: Secondary | ICD-10-CM

## 2021-03-04 DIAGNOSIS — Z131 Encounter for screening for diabetes mellitus: Secondary | ICD-10-CM | POA: Diagnosis not present

## 2021-03-04 DIAGNOSIS — R1032 Left lower quadrant pain: Secondary | ICD-10-CM

## 2021-03-04 DIAGNOSIS — Z0000 Encounter for general adult medical examination without abnormal findings: Secondary | ICD-10-CM | POA: Diagnosis not present

## 2021-03-04 DIAGNOSIS — Z1329 Encounter for screening for other suspected endocrine disorder: Secondary | ICD-10-CM

## 2021-03-04 DIAGNOSIS — R440 Auditory hallucinations: Secondary | ICD-10-CM

## 2021-03-04 DIAGNOSIS — Z1231 Encounter for screening mammogram for malignant neoplasm of breast: Secondary | ICD-10-CM

## 2021-03-04 MED ORDER — RISPERIDONE 1 MG PO TABS
1.0000 mg | ORAL_TABLET | Freq: Every day | ORAL | 3 refills | Status: DC
Start: 2021-03-04 — End: 2022-04-05

## 2021-03-04 MED ORDER — SERTRALINE HCL 50 MG PO TABS
50.0000 mg | ORAL_TABLET | Freq: Every day | ORAL | 3 refills | Status: DC
Start: 2021-03-04 — End: 2022-04-05

## 2021-03-04 MED ORDER — TRIAMCINOLONE ACETONIDE 0.1 % EX CREA: 1.0000 "application " | TOPICAL_CREAM | Freq: Two times a day (BID) | CUTANEOUS | 0 refills | Status: DC

## 2021-03-04 NOTE — Progress Notes (Signed)
Subjective:     Heather Haas is a 45 y.o. female and is here for a comprehensive physical exam. The patient reports problems - see below . Patient recently had what she thought was poison ivy and put some calamine lotion on her right arm.  She did go out in the sun and now there is some irritation.  It is itchy with some scant burning.   She has also had some bilateral lower abdominal cramping in between periods.  She denies any abnormal vaginal bleeding.  She is having regular menstrual cycles. No vaginal itching, burning, discharge. Sexually active with one partner. Uses condoms.  She denies any nausea or vomiting.  She has done nothing to make better.  She has no known family history of ovarian cancer.  Social History   Socioeconomic History   Marital status: Married    Spouse name: Not on file   Number of children: Not on file   Years of education: Not on file   Highest education level: Not on file  Occupational History   Not on file  Tobacco Use   Smoking status: Never   Smokeless tobacco: Never  Substance and Sexual Activity   Alcohol use: No   Drug use: No   Sexual activity: Not on file  Other Topics Concern   Not on file  Social History Narrative   Not on file   Social Determinants of Health   Financial Resource Strain: Not on file  Food Insecurity: Not on file  Transportation Needs: Not on file  Physical Activity: Not on file  Stress: Not on file  Social Connections: Not on file  Intimate Partner Violence: Not on file   Health Maintenance  Topic Date Due   HIV Screening  Never done   Hepatitis C Screening  Never done   MAMMOGRAM  03/20/2020   PAP SMEAR-Modifier  03/04/2021 (Originally 09/20/2020)   Zoster Vaccines- Shingrix (1 of 2) 06/04/2021 (Originally 02/21/1995)   Pneumococcal Vaccine 30-32 Years old (1 - PCV) 03/04/2022 (Originally 02/20/1982)   COLONOSCOPY (Pts 45-3yr Insurance coverage will need to be confirmed)  03/04/2022 (Originally 02/20/2021)    INFLUENZA VACCINE  04/20/2021   TETANUS/TDAP  03/20/2029   HPV VACCINES  Aged Out    The following portions of the patient's history were reviewed and updated as appropriate: allergies, current medications, past family history, past medical history, past social history, past surgical history, and problem list.  Review of Systems Pertinent items are noted in HPI.   Objective:    BP (!) 95/50   Pulse 60   Ht 5' 4"  (1.626 m)   Wt 148 lb (67.1 kg)   SpO2 100%   BMI 25.40 kg/m  General appearance: alert, cooperative, and appears stated age Head: Normocephalic, without obvious abnormality, atraumatic Eyes: conjunctivae/corneas clear. PERRL, EOM's intact. Fundi benign. Ears: normal TM's and external ear canals both ears Nose: Nares normal. Septum midline. Mucosa normal. No drainage or sinus tenderness. Throat: lips, mucosa, and tongue normal; teeth and gums normal Neck: no adenopathy, no carotid bruit, no JVD, supple, symmetrical, trachea midline, and thyroid not enlarged, symmetric, no tenderness/mass/nodules Back: symmetric, no curvature. ROM normal. No CVA tenderness. Lungs: clear to auscultation bilaterally Heart: regular rate and rhythm, S1, S2 normal, no murmur, click, rub or gallop Abdomen: soft, non-tender; bowel sounds normal; no masses,  no organomegaly Extremities: extremities normal, atraumatic, no cyanosis or edema Pulses: 2+ and symmetric Skin: Skin color, texture, turgor normal. No rashes or lesions Lymph nodes: Cervical,  supraclavicular, and axillary nodes normal. Neurologic: Grossly normal   .. Depression screen Heather Haas 2/9 03/04/2021 03/21/2019 06/28/2017  Decreased Interest 0 0 0  Down, Depressed, Hopeless 0 0 0  PHQ - 2 Score 0 0 0   .Marland KitchenNo flowsheet data found.   Assessment:    Healthy female exam.      Plan:    .[name] was seen today for follow-up.  Diagnoses and all orders for this visit:  Routine physical examination -     CBC with  Differential/Platelet -     COMPLETE METABOLIC PANEL WITH GFR -     Lipid Panel w/reflex Direct LDL -     TSH -     MM 3D SCREEN BREAST BILATERAL; Future -     Hepatitis C Antibody -     Cologuard  Screening for lipid disorders -     Lipid Panel w/reflex Direct LDL  Screening for diabetes mellitus -     COMPLETE METABOLIC PANEL WITH GFR  Medication management -     CBC with Differential/Platelet -     COMPLETE METABOLIC PANEL WITH GFR -     Lipid Panel w/reflex Direct LDL -     TSH  Thyroid disorder screen -     TSH  Encounter for screening mammogram for malignant neoplasm of breast -     MM 3D SCREEN BREAST BILATERAL; Future  Paranoid schizophrenia (HCC) -     sertraline (ZOLOFT) 50 MG tablet; Take 1 tablet (50 mg total) by mouth daily. -     risperiDONE (RISPERDAL) 1 MG tablet; Take 1 tablet (1 mg total) by mouth at bedtime.  Auditory hallucinations -     sertraline (ZOLOFT) 50 MG tablet; Take 1 tablet (50 mg total) by mouth daily. -     risperiDONE (RISPERDAL) 1 MG tablet; Take 1 tablet (1 mg total) by mouth at bedtime.  Encounter for hepatitis C screening test for low risk patient -     Hepatitis C Antibody  Colon cancer screening -     Cologuard  Bilateral lower abdominal cramping -     US Pelvic Complete With Transvaginal; Future  Dermatitis -     triamcinolone cream (KENALOG) 0.1 %; Apply 1 application topically 2 (two) times daily.  .. Discussed 150 minutes of exercise a week.  Encouraged vitamin D 1000 units and Calcium 1360m or 4 servings of dairy a day.  PHQ 0 and patient has no concerns. Zoloft and Risperdal refilled.  Fasting labs ordered.  Pap not done. Needs appt.  Mammogram ordered.  Cologuard ordered. Declined colonoscopy.  Per patient had covid vaccines but not aware of dates.  Tdap UTD.   Dermatitis-topical steroid given as needed. Cool compresses.   Having 1 year of bilateral abdominal cramping in btw periods with no menstrual  abnormalities. Likely ovulation cramping. Will get ultrasound to confirm no abnormalities.    See After Visit Summary for Counseling Recommendations

## 2021-03-04 NOTE — Progress Notes (Deleted)
States had Covid vaccines (J and J, then [employer] booster, but no idea dates)  Had poison ivy 3 weeks ago on right arm, used calamine, swollen and itchy  Needs refills

## 2021-03-04 NOTE — Telephone Encounter (Signed)
Cologuard order faxed to [phone] with confirmation received. They will contact the patient directly.

## 2021-03-04 NOTE — Patient Instructions (Signed)
Health Maintenance, Female Adopting a healthy lifestyle and getting preventive care are important in promoting health and wellness. Ask your health care provider about: The right schedule for you to have regular tests and exams. Things you can do on your own to prevent diseases and keep yourself healthy. What should I know about diet, weight, and exercise? Eat a healthy diet  Eat a diet that includes plenty of vegetables, fruits, low-fat dairy products, and lean protein. Do not eat a lot of foods that are high in solid fats, added sugars, or sodium.  Maintain a healthy weight Body mass index (BMI) is used to identify weight problems. It estimates body fat based on height and weight. Your health care provider can help determineyour BMI and help you achieve or maintain a healthy weight. Get regular exercise Get regular exercise. This is one of the most important things you can do for your health. Most adults should: Exercise for at least 150 minutes each week. The exercise should increase your heart rate and make you sweat (moderate-intensity exercise). Do strengthening exercises at least twice a week. This is in addition to the moderate-intensity exercise. Spend less time sitting. Even light physical activity can be beneficial. Watch cholesterol and blood lipids Have your blood tested for lipids and cholesterol at 45 years of age, then havethis test every 5 years. Have your cholesterol levels checked more often if: Your lipid or cholesterol levels are high. You are older than 45 years of age. You are at high risk for heart disease. What should I know about cancer screening? Depending on your health history and family history, you may need to have cancer screening at various ages. This may include screening for: Breast cancer. Cervical cancer. Colorectal cancer. Skin cancer. Lung cancer. What should I know about heart disease, diabetes, and high blood pressure? Blood pressure and heart  disease High blood pressure causes heart disease and increases the risk of stroke. This is more likely to develop in people who have high blood pressure readings, are of African descent, or are overweight. Have your blood pressure checked: Every 3-5 years if you are 18-39 years of age. Every year if you are 40 years old or older. Diabetes Have regular diabetes screenings. This checks your fasting blood sugar level. Have the screening done: Once every three years after age 40 if you are at a normal weight and have a low risk for diabetes. More often and at a younger age if you are overweight or have a high risk for diabetes. What should I know about preventing infection? Hepatitis B If you have a higher risk for hepatitis B, you should be screened for this virus. Talk with your health care provider to find out if you are at risk forhepatitis B infection. Hepatitis C Testing is recommended for: Everyone born from 1945 through 1965. Anyone with known risk factors for hepatitis C. Sexually transmitted infections (STIs) Get screened for STIs, including gonorrhea and chlamydia, if: You are sexually active and are younger than 45 years of age. You are older than 45 years of age and your health care provider tells you that you are at risk for this type of infection. Your sexual activity has changed since you were last screened, and you are at increased risk for chlamydia or gonorrhea. Ask your health care provider if you are at risk. Ask your health care provider about whether you are at high risk for HIV. Your health care provider may recommend a prescription medicine to help   prevent HIV infection. If you choose to take medicine to prevent HIV, you should first get tested for HIV. You should then be tested every 3 months for as long as you are taking the medicine. Pregnancy If you are about to stop having your period (premenopausal) and you may become pregnant, seek counseling before you get  pregnant. Take 400 to 800 micrograms (mcg) of folic acid every day if you become pregnant. Ask for birth control (contraception) if you want to prevent pregnancy. Osteoporosis and menopause Osteoporosis is a disease in which the bones lose minerals and strength with aging. This can result in bone fractures. If you are 72 years old or older, or if you are at risk for osteoporosis and fractures, ask your health care provider if you should: Be screened for bone loss. Take a calcium or vitamin D supplement to lower your risk of fractures. Be given hormone replacement therapy (HRT) to treat symptoms of menopause. Follow these instructions at home: Lifestyle Do not use any products that contain nicotine or tobacco, such as cigarettes, e-cigarettes, and chewing tobacco. If you need help quitting, ask your health care provider. Do not use street drugs. Do not share needles. Ask your health care provider for help if you need support or information about quitting drugs. Alcohol use Do not drink alcohol if: Your health care provider tells you not to drink. You are pregnant, may be pregnant, or are planning to become pregnant. If you drink alcohol: Limit how much you use to 0-1 drink a day. Limit intake if you are breastfeeding. Be aware of how much alcohol is in your drink. In the [place] one drink equals one 12 oz bottle of beer (355 mL), one 5 oz glass of wine (148 mL), or one 1 oz glass of hard liquor (44 mL). General instructions Schedule regular health, dental, and eye exams. Stay current with your vaccines. Tell your health care provider if: You often feel depressed. You have ever been abused or do not feel safe at home. Summary Adopting a healthy lifestyle and getting preventive care are important in promoting health and wellness. Follow your health care provider's instructions about healthy diet, exercising, and getting tested or screened for diseases. Follow your health care provider's  instructions on monitoring your cholesterol and blood pressure. This information is not intended to replace advice given to you by your health care provider. Make sure you discuss any questions you have with your healthcare provider. Document Revised: 08/30/2018 Document Reviewed: 08/30/2018 Elsevier Patient Education  2022 [employer].

## 2021-03-18 ENCOUNTER — Ambulatory Visit: Payer: BC Managed Care – PPO | Admitting: Physician Assistant

## 2021-04-02 LAB — CBC WITH DIFFERENTIAL/PLATELET
Absolute Monocytes: 473 cells/uL (ref 200–950)
Basophils Absolute: 38 cells/uL (ref 0–200)
Basophils Relative: 0.6 %
Eosinophils Absolute: 454 cells/uL (ref 15–500)
Eosinophils Relative: 7.2 %
HCT: 41 % (ref 35.0–45.0)
Hemoglobin: 13.5 g/dL (ref 11.7–15.5)
Lymphs Abs: 2268 cells/uL (ref 850–3900)
MCH: 29.2 pg (ref 27.0–33.0)
MCHC: 32.9 g/dL (ref 32.0–36.0)
MCV: 88.6 fL (ref 80.0–100.0)
MPV: 10.2 fL (ref 7.5–12.5)
Monocytes Relative: 7.5 %
Neutro Abs: 3068 cells/uL (ref 1500–7800)
Neutrophils Relative %: 48.7 %
Platelets: 317 10*3/uL (ref 140–400)
RBC: 4.63 10*6/uL (ref 3.80–5.10)
RDW: 12.8 % (ref 11.0–15.0)
Total Lymphocyte: 36 %
WBC: 6.3 10*3/uL (ref 3.8–10.8)

## 2021-04-02 LAB — HEPATITIS C ANTIBODY
Hepatitis C Ab: NONREACTIVE
SIGNAL TO CUT-OFF: 0.48 (ref <1.00)

## 2021-04-02 LAB — COMPLETE METABOLIC PANEL WITH GFR
AG Ratio: 1.8 (calc) (ref 1.0–2.5)
ALT: 19 U/L (ref 6–29)
AST: 21 U/L (ref 10–35)
Albumin: 4.3 g/dL (ref 3.6–5.1)
Alkaline phosphatase (APISO): 46 U/L (ref 31–125)
BUN: 12 mg/dL (ref 7–25)
CO2: 26 mmol/L (ref 20–32)
Calcium: 9 mg/dL (ref 8.6–10.2)
Chloride: 105 mmol/L (ref 98–110)
Creat: 0.8 mg/dL (ref 0.50–0.99)
Globulin: 2.4 g/dL (calc) (ref 1.9–3.7)
Glucose, Bld: 94 mg/dL (ref 65–99)
Potassium: 3.8 mmol/L (ref 3.5–5.3)
Sodium: 138 mmol/L (ref 135–146)
Total Bilirubin: 0.5 mg/dL (ref 0.2–1.2)
Total Protein: 6.7 g/dL (ref 6.1–8.1)
eGFR: 93 mL/min/{1.73_m2} (ref >=60)

## 2021-04-02 LAB — LIPID PANEL W/REFLEX DIRECT LDL
Cholesterol: 175 mg/dL (ref <200)
HDL: 63 mg/dL (ref >=50)
LDL Cholesterol (Calc): 96 mg/dL (calc)
Non-HDL Cholesterol (Calc): 112 mg/dL (calc) (ref <130)
Total CHOL/HDL Ratio: 2.8 (calc) (ref <5.0)
Triglycerides: 74 mg/dL (ref <150)

## 2021-04-02 LAB — TSH: TSH: 1.95 mIU/L

## 2021-04-02 NOTE — Progress Notes (Signed)
Labs look amazing!!!!

## 2021-04-21 LAB — COLOGUARD: Cologuard: NEGATIVE

## 2021-04-23 ENCOUNTER — Ambulatory Visit: Payer: BC Managed Care – PPO

## 2021-04-23 ENCOUNTER — Other Ambulatory Visit: Payer: BC Managed Care – PPO

## 2021-04-26 LAB — COLOGUARD: Cologuard: NEGATIVE

## 2021-04-28 NOTE — Telephone Encounter (Signed)
Left message on machine for patient to call back for Cologuard results. Cologuard negative. Abstracted.

## 2021-04-28 NOTE — Telephone Encounter (Signed)
Patient made aware of results.

## 2021-04-30 ENCOUNTER — Ambulatory Visit (INDEPENDENT_AMBULATORY_CARE_PROVIDER_SITE_OTHER): Payer: BC Managed Care – PPO

## 2021-04-30 ENCOUNTER — Other Ambulatory Visit: Payer: Self-pay

## 2021-04-30 ENCOUNTER — Emergency Department
Admission: EM | Admit: 2021-04-30 | Discharge: 2021-04-30 | Disposition: A | Discharge status: Home / Self Care | Payer: BC Managed Care – PPO | Source: Home / Self Care | Attending: Family Medicine | Admitting: Family Medicine

## 2021-04-30 ENCOUNTER — Other Ambulatory Visit: Payer: Self-pay | Admitting: Physician Assistant

## 2021-04-30 ENCOUNTER — Encounter: Payer: Self-pay | Admitting: Emergency Medicine

## 2021-04-30 ENCOUNTER — Encounter: Payer: Self-pay | Admitting: Physician Assistant

## 2021-04-30 DIAGNOSIS — R1032 Left lower quadrant pain: Secondary | ICD-10-CM

## 2021-04-30 DIAGNOSIS — R1031 Right lower quadrant pain: Secondary | ICD-10-CM

## 2021-04-30 DIAGNOSIS — Z0000 Encounter for general adult medical examination without abnormal findings: Secondary | ICD-10-CM

## 2021-04-30 DIAGNOSIS — R21 Rash and other nonspecific skin eruption: Secondary | ICD-10-CM | POA: Diagnosis not present

## 2021-04-30 DIAGNOSIS — S70362A Insect bite (nonvenomous), left thigh, initial encounter: Secondary | ICD-10-CM

## 2021-04-30 DIAGNOSIS — Z1231 Encounter for screening mammogram for malignant neoplasm of breast: Secondary | ICD-10-CM

## 2021-04-30 DIAGNOSIS — D259 Leiomyoma of uterus, unspecified: Secondary | ICD-10-CM | POA: Insufficient documentation

## 2021-04-30 MED ORDER — DOXYCYCLINE HYCLATE 100 MG PO CAPS: 100.0000 mg | ORAL_CAPSULE | Freq: Two times a day (BID) | ORAL | 0 refills | Status: DC

## 2021-04-30 NOTE — ED Triage Notes (Signed)
Patient c/o tick bite on her left inner thigh area about 3 days ago.  Patient did remove the tick, now the area is red, some itching.  Patient has applied alcohol to the area.

## 2021-04-30 NOTE — Discharge Instructions (Addendum)
To prevent a tick bite from becoming infected, and to prevent Lyme disease, take 2 doxycycline when you remove the tick.  You will still get an itchy small bump that can last for a couple of weeks, but you will not have any serious infection

## 2021-04-30 NOTE — ED Provider Notes (Signed)
[name]    CSN: [id number] Arrival date & time: 04/30/21  1025      History   Chief Complaint Chief Complaint  Patient presents with   Tick Removal    HPI Heather Haas is a 45 y.o. female.   HPI  Patient removed tick from her leg 2-1/2 days ago.  She has redness at the site.  Slightly itchy.  She like to have this checked.  Worried about tickborne illness.  She is had no fever chills body aches joint pain or signs of infection  History reviewed. No pertinent past medical history.  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      Home Medications    Prior to Admission medications   Medication Sig Start Date End Date Taking? Authorizing Provider  doxycycline (VIBRAMYCIN) 100 MG capsule Take 1 capsule (100 mg total) by mouth 2 (two) times daily. 04/30/21  Yes Raylene Everts, MD  risperiDONE (RISPERDAL) 1 MG tablet Take 1 tablet (1 mg total) by mouth at bedtime. 03/04/21  Yes Breeback, Jade L, PA-C  sertraline (ZOLOFT) 50 MG tablet Take 1 tablet (50 mg total) by mouth daily. 03/04/21  Yes Breeback, Jade L, PA-C  triamcinolone cream (KENALOG) 0.1 % Apply 1 application topically 2 (two) times daily. 03/04/21  Yes Breeback, Royetta Car, PA-C    Family  History Family History  Problem Relation Age of Onset   Alcohol abuse Father    Alcohol abuse Brother    Cancer Maternal Aunt    Diabetes Maternal Uncle    Stroke Maternal Grandmother    Diabetes Paternal Grandmother     Social History Social History   Tobacco Use   Smoking status: Never   Smokeless tobacco: Never  Substance Use Topics   Alcohol use: No   Drug use: No     Allergies   Diflucan [fluconazole] and Penicillins   Review of Systems Review of Systems See HPI  Physical Exam Triage Vital Signs ED Triage Vitals  Enc Vitals Group     BP 04/30/21 1047 100/63     Pulse Rate 04/30/21 1047 (!) 53     Resp --      Temp 04/30/21 1047 98.2 F (36.8 C)     Temp Source 04/30/21 1047 Oral     SpO2 04/30/21 1047 100 %     Weight 04/30/21 1049 148 lb (67.1 kg)     Height 04/30/21 1049 5' 4"  (1.626 m)     Head Circumference --      Peak Flow --      Pain Score 04/30/21 1049 0     Pain Loc --      Pain Edu? --      Excl. in [place]? --  No data found.  Updated Vital Signs BP 100/63 (BP Location: Right Arm)   Pulse (!) 53   Temp 98.2 F (36.8 C) (Oral)   Ht 5' 4"  (1.626 m)   Wt 67.1 kg   SpO2 100%   BMI 25.40 kg/m       Physical Exam Constitutional:      General: She is not in acute distress.    Appearance: She is well-developed.  HENT:     Head: Normocephalic and atraumatic.  Eyes:     Conjunctiva/sclera: Conjunctivae normal.     Pupils: Pupils are equal, round, and reactive to light.  Cardiovascular:     Rate and Rhythm: Normal rate.  Pulmonary:     Effort: Pulmonary effort is normal. No respiratory distress.  Abdominal:     General: There is no distension.     Palpations: Abdomen is soft.  Musculoskeletal:        General: Normal range of motion.     Cervical back: Normal range of motion.  Skin:    General: Skin is warm and dry.     Comments: Upper left thigh, medial portion has a 5 mm x 1 and half centimeter erythematous macule.  No  clearing in center indicating a bull's-eye  Neurological:     Mental Status: She is alert.     UC Treatments / Results  Labs (all labs ordered are listed, but only abnormal results are displayed) Labs Reviewed - No data to display  EKG   Radiology No results found.  Procedures Procedures (including critical care time)  Medications Ordered in UC Medications - No data to display  Initial Impression / Assessment and Plan / UC Course  I have reviewed the triage vital signs and the nursing notes.  Pertinent labs & imaging results that were available during my care of the patient were reviewed by me and considered in my medical decision making (see chart for details).     I explained to the patient that since she removed the tick the same day that it bit her, it is unlikely that any disease was transmitted.  I did give her doxycycline to take a dose now and then dose after any other additional tick bites.  She is encouraged to keep walking.  She is encouraged to use insect repellent, long sleeves and pants Final Clinical Impressions(s) / UC Diagnoses   Final diagnoses:  Rash and nonspecific skin eruption  Tick bite of left thigh, initial encounter     Discharge Instructions      To prevent a tick bite from becoming infected, and to prevent Lyme disease, take 2 doxycycline when you remove the tick.  You will still get an itchy small bump that can last for a couple of weeks, but you will not have any serious infection   ED Prescriptions     Medication Sig Dispense Auth. Provider   doxycycline (VIBRAMYCIN) 100 MG capsule Take 1 capsule (100 mg total) by mouth 2 (two) times daily. 14 capsule Raylene Everts, MD      PDMP not reviewed this encounter.   Raylene Everts, MD 04/30/21 1331

## 2021-04-30 NOTE — Progress Notes (Signed)
Your pelvic ultrasound shows multiple fibroids. These can cause cramping and more than normal bleeding. You don't have to have intervention but you can. Would you like referral to GYN to discuss options.

## 2021-05-01 ENCOUNTER — Other Ambulatory Visit: Payer: Self-pay | Admitting: Neurology

## 2021-05-01 DIAGNOSIS — D219 Benign neoplasm of connective and other soft tissue, unspecified: Secondary | ICD-10-CM

## 2021-05-04 ENCOUNTER — Telehealth: Payer: Self-pay | Admitting: Emergency Medicine

## 2021-05-04 NOTE — Progress Notes (Signed)
Normal mammogram. Follow up in 1 year.

## 2021-05-14 ENCOUNTER — Other Ambulatory Visit: Payer: Self-pay

## 2021-05-14 ENCOUNTER — Ambulatory Visit: Payer: BC Managed Care – PPO | Admitting: Obstetrics and Gynecology

## 2021-05-14 ENCOUNTER — Encounter: Payer: Self-pay | Admitting: Obstetrics and Gynecology

## 2021-05-14 VITALS — BP 88/55 | HR 57 | Ht 64.0 in | Wt 149.0 lb

## 2021-05-14 DIAGNOSIS — R1031 Right lower quadrant pain: Secondary | ICD-10-CM

## 2021-05-14 DIAGNOSIS — D219 Benign neoplasm of connective and other soft tissue, unspecified: Secondary | ICD-10-CM | POA: Diagnosis not present

## 2021-05-14 DIAGNOSIS — R1032 Left lower quadrant pain: Secondary | ICD-10-CM

## 2021-05-14 NOTE — Consult Note (Signed)
Cramping before period, severe No meds  Cycles regular No recent pap Remote abnormal pap - colposcopy over 20 years ago, done in the [place] No known fibroids

## 2021-05-14 NOTE — Progress Notes (Signed)
GYNECOLOGY OFFICE VISIT NOTE  History:   Heather Haas is a 45 y.o. G1P1 here today for discussion regarding her fibroids.  She saw her PCP and noted she had abdominal cramping. She reports the cramping is painful but she does not take medication for it. She would say it is severe. It usually happens before her period. Her cycles are regular and they are not heavy.  She denies any abnormal vaginal discharge, bleeding, pelvic pain or other concerns.    History reviewed. No pertinent past medical history.  Past Surgical History:  Procedure Laterality Date   GYNECOLOGIC CRYOSURGERY      The following portions of the patient's history were reviewed and updated as appropriate: allergies, current medications, past family history, past medical history, past social history, past surgical history and problem list.   Health Maintenance:  She thinks she might have had cryosurgery over 20 years ago. No recent pap. She denies any abnormal paps since then.  Normal mammogram on 04/2021.   Review of Systems:  Pertinent items noted in HPI and remainder of comprehensive ROS otherwise negative.  Physical Exam:  BP (!) 88/55   Pulse (!) 57   Ht 5' 4"  (1.626 m)   Wt 149 lb (67.6 kg)   BMI 25.58 kg/m  CONSTITUTIONAL: Well-developed, well-nourished female in no acute distress.  HEENT:  Normocephalic, atraumatic. External right and left ear normal. No scleral icterus.  NECK: Normal range of motion, supple, no masses noted on observation SKIN: No rash noted. Not diaphoretic. No erythema. No pallor. MUSCULOSKELETAL: Normal range of motion. No edema noted. NEUROLOGIC: Alert and oriented to person, place, and time. Normal muscle tone coordination. No cranial nerve deficit noted. PSYCHIATRIC: Normal mood and affect. Normal behavior. Normal judgment and thought content.  CARDIOVASCULAR: Normal heart rate noted RESPIRATORY: Effort and breath sounds normal, no problems with respiration noted ABDOMEN: No  masses noted. No other overt distention noted.    PELVIC: Deferred  Labs and Imaging No results found for this or any previous visit (from the past 168 hour(s)). MM 3D SCREEN BREAST BILATERAL  Result Date: 05/01/2021 CLINICAL DATA:  Screening. EXAM: DIGITAL SCREENING BILATERAL MAMMOGRAM WITH TOMOSYNTHESIS AND CAD TECHNIQUE: Bilateral screening digital craniocaudal and mediolateral oblique mammograms were obtained. Bilateral screening digital breast tomosynthesis was performed. The images were evaluated with computer-aided detection. COMPARISON:  Previous exam(s). ACR Breast Density Category b: There are scattered areas of fibroglandular density. FINDINGS: There are no findings suspicious for malignancy. IMPRESSION: No mammographic evidence of malignancy. A result letter of this screening mammogram will be mailed directly to the patient. RECOMMENDATION: Screening mammogram in one year. (Code:SM-B-01Y) BI-RADS CATEGORY  1: Negative. Electronically Signed   By: Nolon Nations M.D.   On: 05/01/2021 15:15   US Pelvic Complete With Transvaginal  Result Date: 04/30/2021 CLINICAL DATA:  BILATERAL lower abdominal cramping in between menses, LMP last month uncertain date EXAM: TRANSABDOMINAL AND TRANSVAGINAL ULTRASOUND OF PELVIS TECHNIQUE: Both transabdominal and transvaginal ultrasound examinations of the pelvis were performed. Transabdominal technique was performed for global imaging of the pelvis including uterus, ovaries, adnexal regions, and pelvic cul-de-sac. It was necessary to proceed with endovaginal exam following the transabdominal exam to visualize the endometrium and adnexa. COMPARISON:  None FINDINGS: Uterus Measurements: 9.7 x 6.6 x 6.6 cm = volume: 220 mL. Anteverted. Heterogeneous myometrium. Multiple vague nodular foci likely representing leiomyomata. These 2.6 cm diameter subserosal leiomyoma at posterior upper uterus, 2.7 cm diameter intramural leiomyoma anterior upper uterus, and 3.5 cm  diameter  intramural leiomyoma at posterior mid uterus which may extend submucosal. Endometrium Thickness: 8 mm.  No endometrial fluid or focal abnormality Right ovary Measurements: 1.9 x 1.2 x 1.5 cm = volume: 2 mL. Normal morphology without mass Left ovary Measurements: 2.0 x 1.4 x 1.9 cm = volume: 3 mL. Normal morphology without mass Other findings No free pelvic fluid.  No adnexal masses. IMPRESSION: Three probable intramural leiomyomata as above, including a 3.5 cm diameter posterior mid uterine leiomyoma which may extend submucosal. Unremarkable endometrial complex and ovaries. Electronically Signed   By: Lavonia Dana M.D.   On: 04/30/2021 13:44     I reviewed the report and images myself and reviewed the images with the patient to explain them to her.   Prior HgB was 13.5 Assessment and Plan:  Finn was seen today for f/u from pcp for fibroids.  Fibroids - Uterine fibroids: The patient's fibroids are asymptomatic and small. She has 2 subserosal fibroids and one intramural with a potential submucosal component.  - We reviewed the benign nature of fibroids and that they are common - 30% of women have them - Reviewed they may continue to grow slowly until menopause at which point they stop growing and usually shrink - We reviewed the features that would make [place] concerned for malignancy - she has none of these features. We also discussed malignant fibroids are rare.  - Expectant management-The patient's fibroids were discussed and expectant management was offered with strict precautions. We discussed circumstances for reevaluation of the fibroids -- heavy bleeding, worsening pain, new pressure symptoms, or a change in the exam.  - I recommended she return for annual exam for screening for cervical cancer since she is due for pap smear  Bilateral lower abdominal cramping - Discussed depending on the timing of her pain it is either associated with ovulation or it could be adenomyosis or potentially  even endometriosis. Information was given on adenomyosis as this would be more likely at this point. However, at this time, she does not take any OTC measures for the pain so I would not advise any additional intervention at this time besides ibuprofren or tylenol.  - [place] was otherwise normal and we discussed fibroids of this size would not likely be the source for her cramping.   Routine preventative health maintenance measures emphasized. Please refer to After Visit Summary for other counseling recommendations.   Return in about 6 months (around 11/14/2021).  Or sooner for annual exam.   I spent  46  minutes dedicated to the care of this patient including pre-visit review of records, face to face time with the patient discussing her conditions and treatments and post visit orders.    Radene Gunning, MD, [place] for [facility], [place]

## 2021-06-22 NOTE — Progress Notes (Signed)
GYNECOLOGY ANNUAL PREVENTATIVE CARE ENCOUNTER NOTE  History:     Heather Haas is a 45 y.o. G1P1 female here for a routine annual gynecologic exam.  Current complaints: none.     Denies abnormal vaginal bleeding, discharge, pelvic pain, problems with intercourse or other gynecologic concerns.      Gynecologic History Patient's last menstrual period was 06/19/2021. Contraception: none Last Pap: no recent Last Mammogram: 04/2021.  Result was normal Last Colonoscopy: cologuard.  Result was normal  Obstetric History OB History  Gravida Para Term Preterm AB Living  1 1          SAB IAB Ectopic Multiple Live Births               # Outcome Date GA Lbr Len/2nd Weight Sex Delivery Anes PTL Lv  1 Para      Vag-Spont       Past Medical History:  Diagnosis Date   Anxiety    Depression    Vaginal Pap smear, abnormal     Past Surgical History:  Procedure Laterality Date   cervical cryo     GYNECOLOGIC CRYOSURGERY      Current Outpatient Medications on File Prior to Visit  Medication Sig Dispense Refill   risperiDONE (RISPERDAL) 1 MG tablet Take 1 tablet (1 mg total) by mouth at bedtime. 90 tablet 3   sertraline (ZOLOFT) 50 MG tablet Take 1 tablet (50 mg total) by mouth daily. 90 tablet 3   No current facility-administered medications on file prior to visit.    Allergies  Allergen Reactions   Diflucan [Fluconazole]     RASH.    Penicillins     Social History:  reports that she has never smoked. She has never used smokeless tobacco. She reports that she does not drink alcohol and does not use drugs.  Family History  Problem Relation Age of Onset   Alcohol abuse Father    Alcohol abuse Brother    Cancer Maternal Aunt    Diabetes Maternal Uncle    Stroke Maternal Grandmother    Diabetes Paternal Grandmother     The following portions of the patient's history were reviewed and updated as appropriate: allergies, current medications, past family history, past  medical history, past social history, past surgical history and problem list.  Review of Systems Pertinent items noted in HPI and remainder of comprehensive ROS otherwise negative.  Physical Exam:  BP (!) 94/59   Pulse 60   Resp 16   Ht 5' 4"  (1.626 m)   Wt 151 lb (68.5 kg)   LMP 06/19/2021   BMI 25.92 kg/m  CONSTITUTIONAL: Well-developed, well-nourished female in no acute distress.  HENT:  Normocephalic, atraumatic, External right and left ear normal.  EYES: Conjunctivae and EOM are normal. Pupils are equal, round, and reactive to light. No scleral icterus.  NECK: Normal range of motion, supple, no masses.  Normal thyroid.  SKIN: Skin is warm and dry. No rash noted. Not diaphoretic. No erythema. No pallor. MUSCULOSKELETAL: Normal range of motion. No tenderness.  No cyanosis, clubbing, or edema. NEUROLOGIC: Alert and oriented to person, place, and time. Normal reflexes, muscle tone coordination.  PSYCHIATRIC: Normal mood and affect. Normal behavior. Normal judgment and thought content.  CARDIOVASCULAR: Normal heart rate noted, regular rhythm RESPIRATORY: Clear to auscultation bilaterally. Effort and breath sounds normal, no problems with respiration noted.  BREASTS: Symmetric in size. No masses, tenderness, skin changes, nipple drainage, or lymphadenopathy bilaterally. Performed in the presence of a  chaperone. ABDOMEN: Soft, no distention noted.  No tenderness, rebound or guarding.  PELVIC: External genitalia normal, Vagina normal without discharge, Urethra without abnormality or discharge, no bladder tenderness, cervix normal in appearance, no CMT, uterus normal size, shape, and consistency, no adnexal masses or tenderness. Performed in the presence of a chaperone.    Assessment and Plan:  Heather Haas was seen today for gynecologic exam.  Diagnoses and all orders for this visit:  Encounter for annual routine gynecological examination - Cervical cancer screening: Discussed guidelines.  Pap with HPV done  - Gardasil: never had - STD Testing: declines - Birth Control: Discussed options and their risks, benefits and common side effects; discussed VTE with estrogen containing options. Desires:  none - declines. Discussed although low risk, there is a chance of pregnancy.  - Breast Health: Encouraged self breast awareness/SBE. Teaching provided. Discussed limits of clinical breast exam for detecting breast cancer. MXR up to date  - F/U 12 months and prn   Routine preventative health maintenance measures emphasized. Please refer to After Visit Summary for other counseling recommendations.      Radene Gunning, MD, [place] for [facility], [place]

## 2021-06-25 ENCOUNTER — Ambulatory Visit (INDEPENDENT_AMBULATORY_CARE_PROVIDER_SITE_OTHER): Payer: BC Managed Care – PPO | Admitting: Obstetrics and Gynecology

## 2021-06-25 ENCOUNTER — Other Ambulatory Visit (HOSPITAL_COMMUNITY)
Admission: RE | Admit: 2021-06-25 | Discharge: 2021-06-25 | Disposition: A | Discharge status: Home / Self Care | Payer: BC Managed Care – PPO | Source: Ambulatory Visit | Attending: Obstetrics and Gynecology | Admitting: Obstetrics and Gynecology

## 2021-06-25 ENCOUNTER — Encounter: Payer: Self-pay | Admitting: Obstetrics and Gynecology

## 2021-06-25 ENCOUNTER — Other Ambulatory Visit: Payer: Self-pay

## 2021-06-25 VITALS — BP 94/59 | HR 60 | Resp 16 | Ht 64.0 in | Wt 151.0 lb

## 2021-06-25 DIAGNOSIS — Z01419 Encounter for gynecological examination (general) (routine) without abnormal findings: Secondary | ICD-10-CM | POA: Insufficient documentation

## 2021-06-30 LAB — CYTOLOGY - PAP
Comment: NEGATIVE
Diagnosis: UNDETERMINED — AB
High risk HPV: POSITIVE — AB

## 2021-11-03 ENCOUNTER — Telehealth: Payer: Self-pay | Admitting: *Deleted

## 2021-11-03 NOTE — Telephone Encounter (Signed)
Left patient a message to call and schedule 6 month F/U with Dr. Damita Dunnings.

## 2021-12-09 ENCOUNTER — Other Ambulatory Visit: Payer: Self-pay

## 2021-12-09 ENCOUNTER — Encounter: Payer: Self-pay | Admitting: Physician Assistant

## 2021-12-09 ENCOUNTER — Ambulatory Visit: Payer: BC Managed Care – PPO | Admitting: Physician Assistant

## 2021-12-09 VITALS — BP 103/51 | HR 64 | Temp 98.1°F | Ht 63.0 in | Wt 153.0 lb

## 2021-12-09 DIAGNOSIS — N3001 Acute cystitis with hematuria: Secondary | ICD-10-CM

## 2021-12-09 DIAGNOSIS — R350 Frequency of micturition: Secondary | ICD-10-CM

## 2021-12-09 DIAGNOSIS — R339 Retention of urine, unspecified: Secondary | ICD-10-CM | POA: Diagnosis not present

## 2021-12-09 LAB — POCT URINALYSIS DIP (CLINITEK)
Bilirubin, UA: NEGATIVE
Glucose, UA: NEGATIVE mg/dL
Ketones, POC UA: NEGATIVE mg/dL
Nitrite, UA: NEGATIVE
POC PROTEIN,UA: NEGATIVE
Spec Grav, UA: 1.025 (ref 1.010–1.025)
Urobilinogen, UA: 0.2 E.U./dL
pH, UA: 6 (ref 5.0–8.0)

## 2021-12-09 MED ORDER — SULFAMETHOXAZOLE-TRIMETHOPRIM 800-160 MG PO TABS
1.0000 | ORAL_TABLET | Freq: Two times a day (BID) | ORAL | 0 refills | Status: DC
Start: 2021-12-09 — End: 2021-12-11

## 2021-12-09 NOTE — Patient Instructions (Signed)

## 2021-12-09 NOTE — Progress Notes (Signed)
? ?Subjective:  ? ? Patient ID: Heather Haas, female    DOB: 1976/04/28, 46 y.o.   MRN: [id number] ? ?HPI ?Pt is a 46 yo female who presents to the clinic with 5 days of urinary urgency, frequency, discomfort. No fever, chills, flank pain. Not tried anything to make better. Never had UTI. She did have sex Friday night a day before symptoms. No new sexual partners. No vaginal discharge or odor.  ? ?.. ?Active Ambulatory Problems  ?  Diagnosis Date Noted  ? 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 Cervical spondylosis 01/02/2014  ? Bilateral hand numbness 01/04/2014  ? Dermatitis 07/08/2015  ? Lesion of palate 01/28/2016  ? Eosinophilia 03/29/2016  ? Low iron stores 03/29/2016  ? No energy 03/29/2016  ? Myalgia 03/29/2016  ? Multiple joint pain 03/29/2016  ? RLS (restless legs syndrome) 03/29/2016  ? Paranoid schizophrenia ([place]) 09/14/2016  ? Auditory hallucinations 09/14/2016  ? Tingling of skin 06/28/2017  ? Blue nevus 03/21/2019  ? Dizziness 03/21/2019  ? Hypotension 03/21/2019  ? Bilateral lower abdominal cramping 03/04/2021  ? Uterine fibroid 04/30/2021  ? ?Resolved Ambulatory Problems  ?  Diagnosis Date Noted  ? No Resolved Ambulatory Problems  ? ?Past Medical History:  ?Diagnosis Date  ? Anxiety   ? Depression   ? Vaginal Pap smear, abnormal   ? ? ?Review of Systems ?See HPI.  ?   ?Objective:  ? Physical Exam ?Vitals reviewed.  ?Constitutional:   ?   Appearance: Normal appearance.  ?HENT:  ?   Head: Normocephalic.  ?Cardiovascular:  ?   Rate and Rhythm: Normal rate.  ?   Pulses: Normal pulses.  ?   Heart sounds: Normal heart sounds.  ?Pulmonary:  ?   Effort: Pulmonary effort is normal.  ?   Breath sounds: Normal breath sounds.  ?Abdominal:  ?   General: There is no distension.  ?   Palpations: Abdomen is soft. There is no mass.  ?   Tenderness: There is abdominal tenderness. There is no right CVA tenderness, left CVA tenderness, guarding or rebound.  ?   Hernia: No hernia is present.  ?   Comments: Suprapubic  tenderness to palpation.   ?Neurological:  ?   Mental Status: She is alert.  ?Psychiatric:     ?   Mood and Affect: Mood normal.  ? ?.. ?Results for orders placed or performed in visit on 12/09/21  ?POCT URINALYSIS DIP (CLINITEK)  ?Result Value Ref Range  ? Color, UA yellow yellow  ? Clarity, UA clear clear  ? Glucose, UA negative negative mg/dL  ? Bilirubin, UA negative negative  ? Ketones, POC UA negative negative mg/dL  ? Spec Grav, UA 1.025 1.010 - 1.025  ? Blood, UA trace-intact (A) negative  ? pH, UA 6.0 5.0 - 8.0  ? POC PROTEIN,UA negative negative, trace  ? Urobilinogen, UA 0.2 0.2 or 1.0 E.U./dL  ? Nitrite, UA Negative Negative  ? Leukocytes, UA Small (1+) (A) Negative  ? ? ? ? ? ? ?   ?Assessment & Plan:  ?..Santia was seen today for urinary frequency. ? ?Diagnoses and all orders for this visit: ? ?Acute cystitis with hematuria ?-     POCT URINALYSIS DIP (CLINITEK) ?-     Urine Culture ?-     sulfamethoxazole-trimethoprim (BACTRIM DS) 800-160 MG tablet; Take 1 tablet by mouth 2 (two) times daily. ? ?Urinary frequency ?-     POCT URINALYSIS DIP (CLINITEK) ?-     Urine Culture ?-  sulfamethoxazole-trimethoprim (BACTRIM DS) 800-160 MG tablet; Take 1 tablet by mouth 2 (two) times daily. ? ?Urinary retention ?-     POCT URINALYSIS DIP (CLINITEK) ?-     Urine Culture ?-     sulfamethoxazole-trimethoprim (BACTRIM DS) 800-160 MG tablet; Take 1 tablet by mouth 2 (two) times daily. ? ?UA positive for leuks and blood ?Will culture ?Start bactrim for uncomplicated UTI ?HO given with symptomatic care ?Follow up as needed or if symptoms persist ? ? ?

## 2021-12-11 ENCOUNTER — Other Ambulatory Visit: Payer: Self-pay | Admitting: Physician Assistant

## 2021-12-11 LAB — URINE CULTURE
MICRO NUMBER:: 13168741
SPECIMEN QUALITY:: ADEQUATE

## 2021-12-11 MED ORDER — NITROFURANTOIN MONOHYD MACRO 100 MG PO CAPS: 100.0000 mg | ORAL_CAPSULE | Freq: Two times a day (BID) | ORAL | 0 refills | Status: DC

## 2021-12-11 NOTE — Progress Notes (Signed)
Stop bactrim infection is resistant to this. Start macrobid for 7 days for e.coli urinary tract infection.

## 2022-04-04 ENCOUNTER — Other Ambulatory Visit: Payer: Self-pay | Admitting: Physician Assistant

## 2022-04-04 DIAGNOSIS — F200 Paranoid schizophrenia: Secondary | ICD-10-CM

## 2022-04-04 DIAGNOSIS — R440 Auditory hallucinations: Secondary | ICD-10-CM

## 2022-07-08 ENCOUNTER — Other Ambulatory Visit: Payer: Self-pay | Admitting: Physician Assistant

## 2022-07-08 DIAGNOSIS — R440 Auditory hallucinations: Secondary | ICD-10-CM

## 2022-07-08 DIAGNOSIS — F200 Paranoid schizophrenia: Secondary | ICD-10-CM

## 2022-07-22 ENCOUNTER — Other Ambulatory Visit: Payer: Self-pay | Admitting: Physician Assistant

## 2022-07-22 DIAGNOSIS — R440 Auditory hallucinations: Secondary | ICD-10-CM

## 2022-07-22 DIAGNOSIS — F200 Paranoid schizophrenia: Secondary | ICD-10-CM

## 2022-11-25 IMAGING — MG MM DIGITAL SCREENING BILAT W/ TOMO AND CAD
8 series · 8 of 24 positions shown · non-contrast
Comparison: Previous exam(s).

CLINICAL DATA: Screening.

EXAM:
DIGITAL SCREENING BILATERAL MAMMOGRAM WITH TOMOSYNTHESIS AND CAD
TECHNIQUE: Bilateral screening digital craniocaudal and mediolateral oblique
mammograms were obtained. Bilateral screening digital breast
tomosynthesis was performed. The images were evaluated with
computer-aided detection.

[R CC synth-2D]
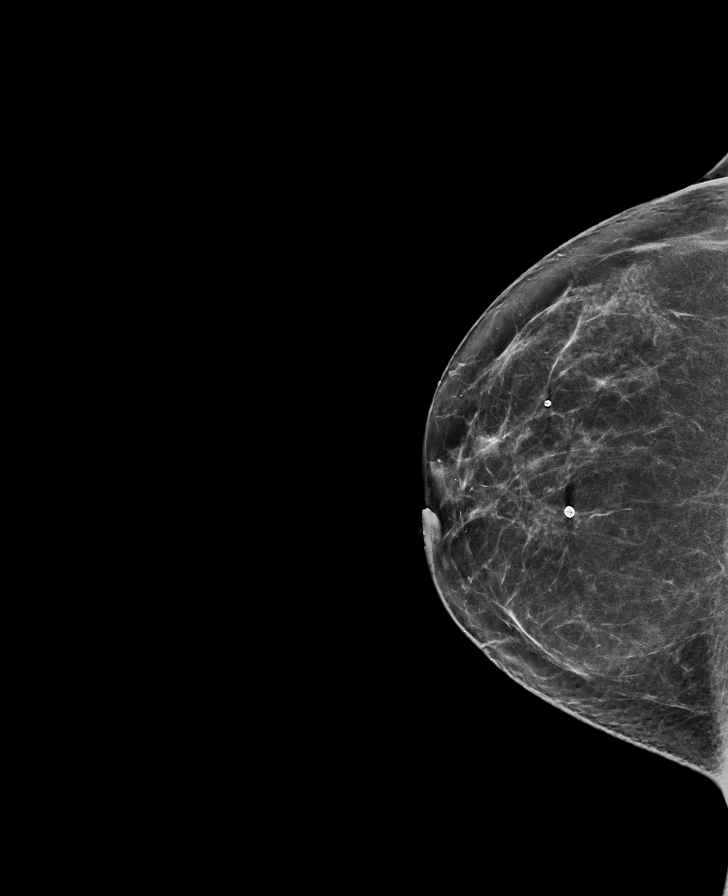

[L MLO synth-2D]
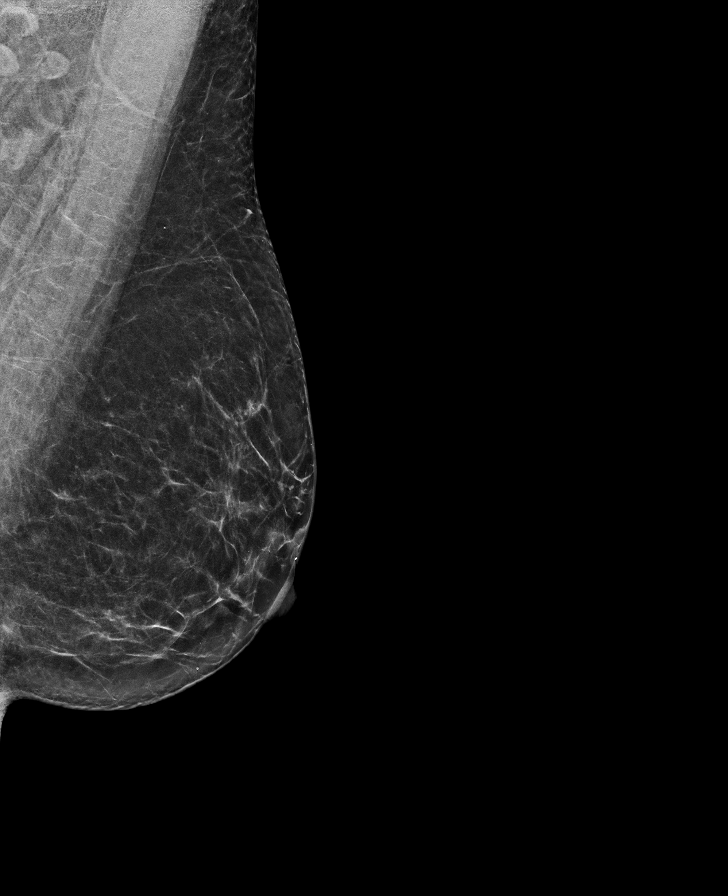

[R MLO synth-2D]
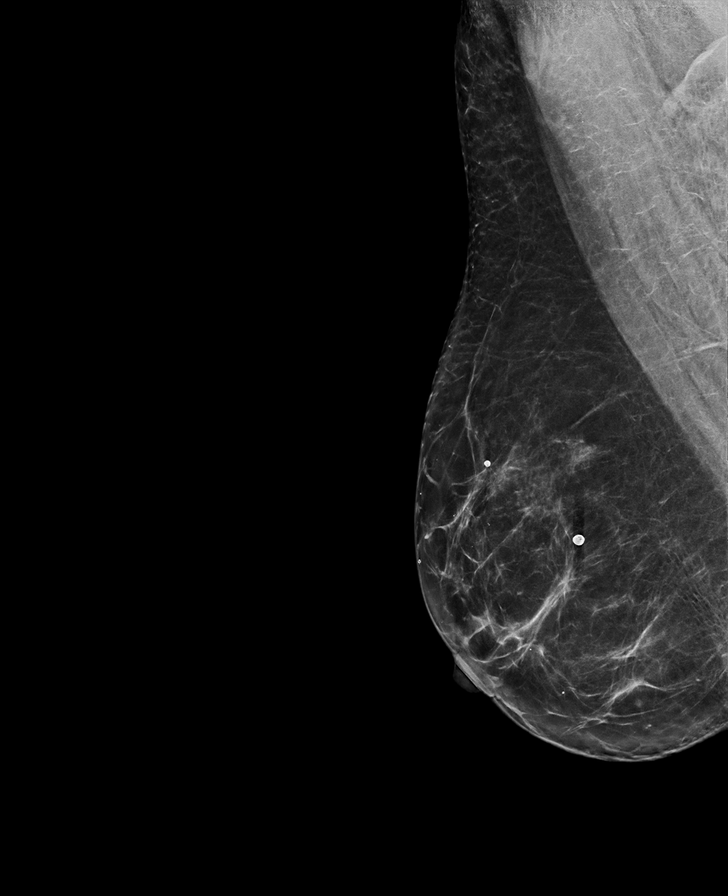

[L CC synth-2D]
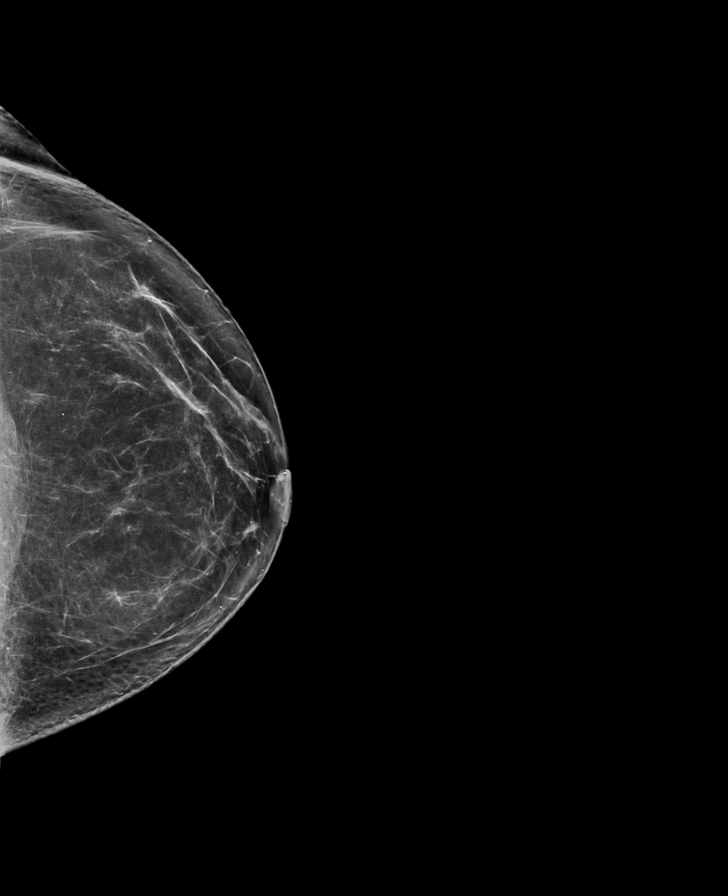

[R CC tomo · tomo slice 37/73.0]
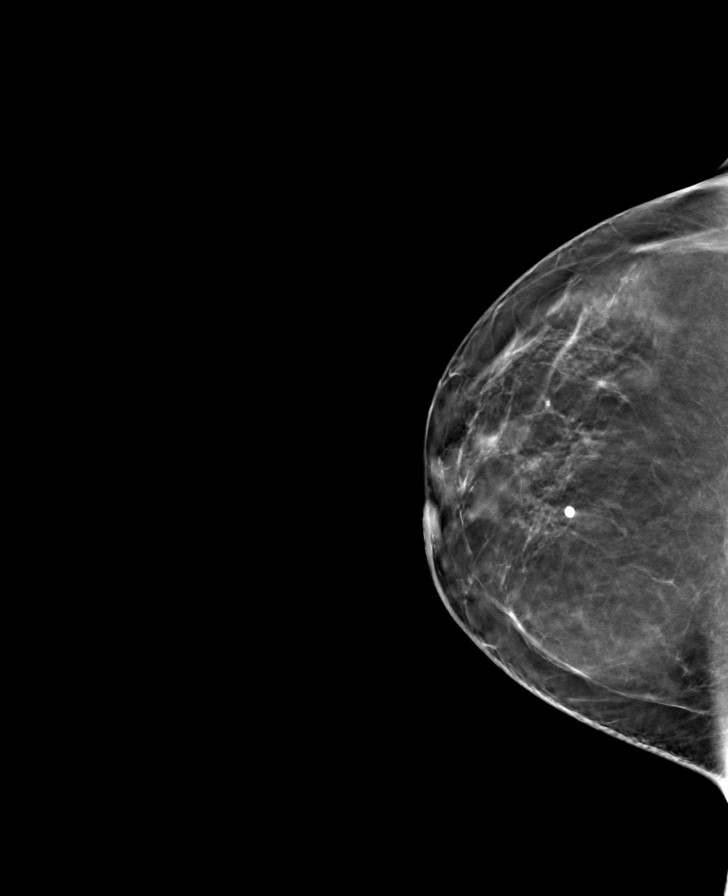

[L MLO tomo · tomo slice 35/70.0]
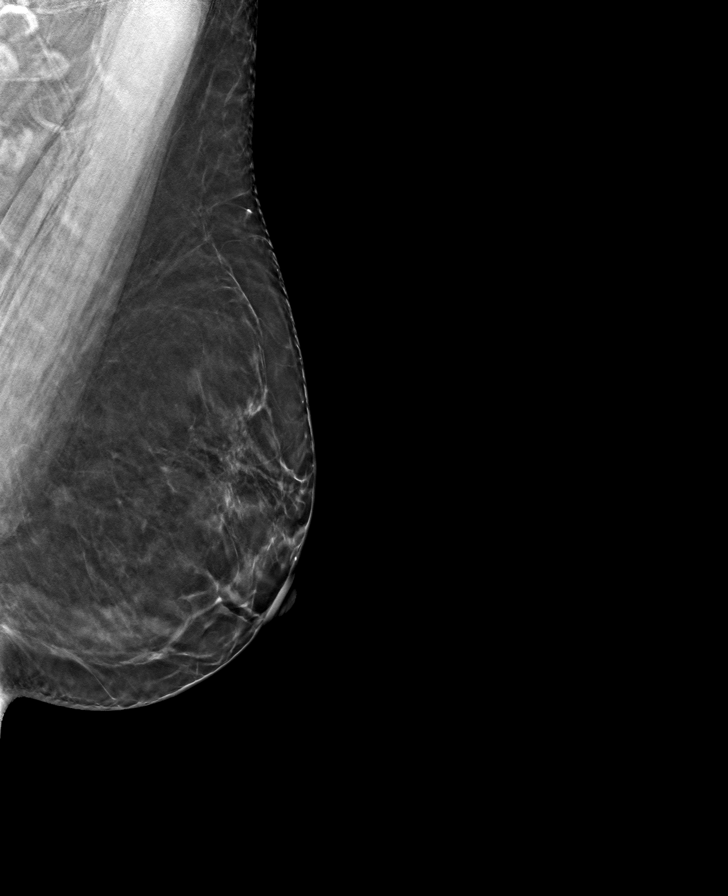

[R MLO tomo · tomo slice 41/81.0]
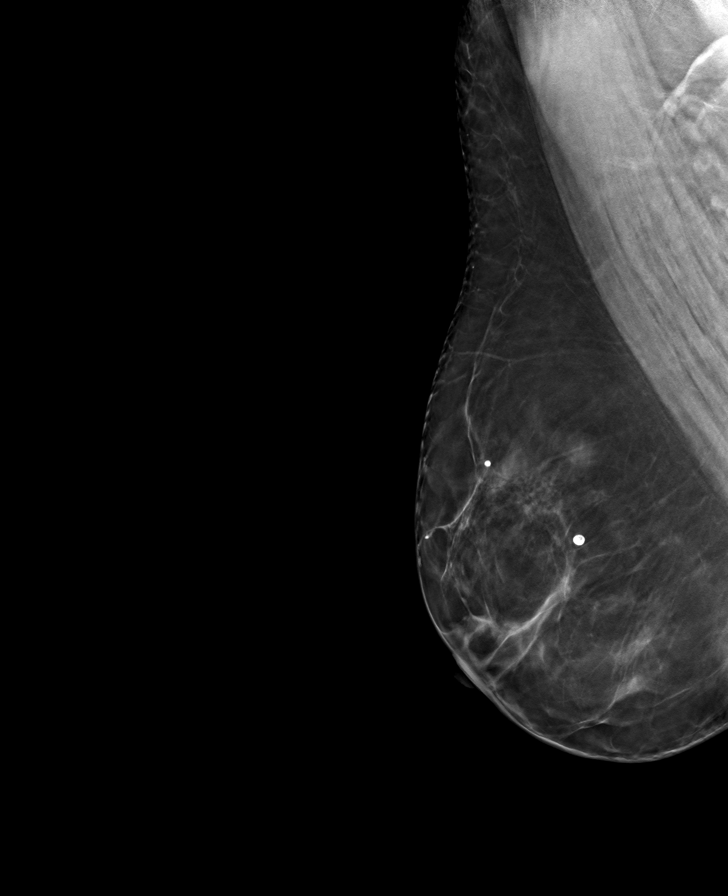

[L CC tomo · tomo slice 39/76.0]
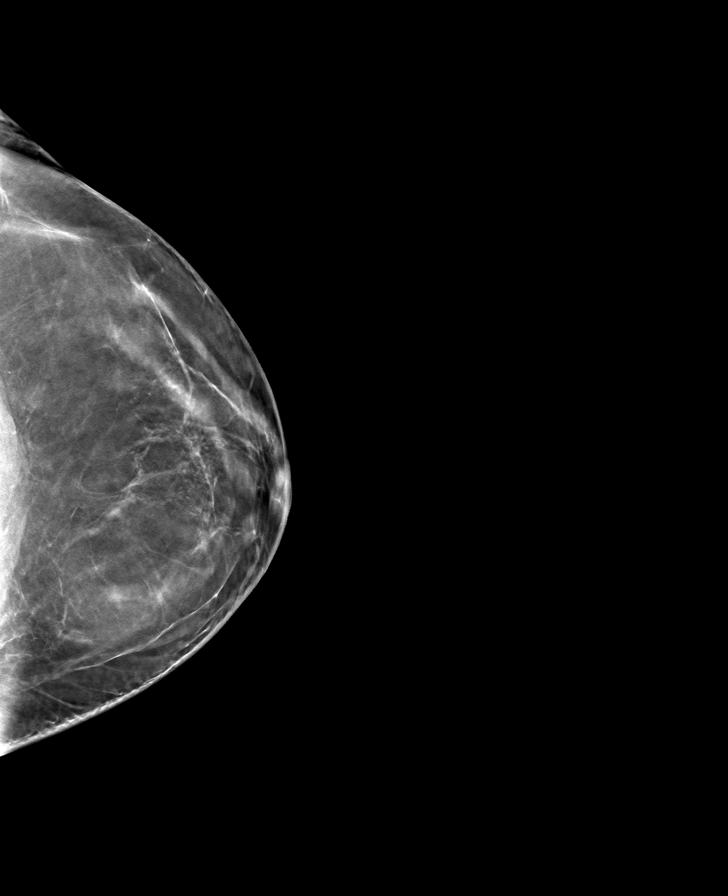

[8 of 24 positions shown; findings below may reference images not displayed]

ACR Breast Density Category b: There are scattered areas of
fibroglandular density.
FINDINGS: There are no findings suspicious for malignancy.
IMPRESSION: No mammographic evidence of malignancy. A result letter of this
screening mammogram will be mailed directly to the patient.

RECOMMENDATION:
Screening mammogram in one year. (Code:51-O-LD2)

BI-RADS CATEGORY  1: Negative.

## 2023-03-04 ENCOUNTER — Ambulatory Visit (INDEPENDENT_AMBULATORY_CARE_PROVIDER_SITE_OTHER): Payer: BC Managed Care – PPO | Admitting: Family Medicine

## 2023-03-04 ENCOUNTER — Encounter: Payer: Self-pay | Admitting: Family Medicine

## 2023-03-04 VITALS — BP 100/60 | HR 67 | Resp 18 | Ht 67.0 in | Wt 147.8 lb

## 2023-03-04 DIAGNOSIS — R440 Auditory hallucinations: Secondary | ICD-10-CM

## 2023-03-04 DIAGNOSIS — W57XXXA Bitten or stung by nonvenomous insect and other nonvenomous arthropods, initial encounter: Secondary | ICD-10-CM | POA: Diagnosis not present

## 2023-03-04 DIAGNOSIS — L249 Irritant contact dermatitis, unspecified cause: Secondary | ICD-10-CM | POA: Diagnosis not present

## 2023-03-04 DIAGNOSIS — S30860A Insect bite (nonvenomous) of lower back and pelvis, initial encounter: Secondary | ICD-10-CM | POA: Diagnosis not present

## 2023-03-04 DIAGNOSIS — F200 Paranoid schizophrenia: Secondary | ICD-10-CM | POA: Diagnosis not present

## 2023-03-04 MED ORDER — SERTRALINE HCL 50 MG PO TABS: 50.0000 mg | ORAL_TABLET | Freq: Every day | ORAL | 0 refills | Status: DC

## 2023-03-04 MED ORDER — TRIAMCINOLONE ACETONIDE 0.5 % EX CREA: 1.0000 | TOPICAL_CREAM | Freq: Two times a day (BID) | CUTANEOUS | 3 refills | Status: DC

## 2023-03-04 MED ORDER — RISPERIDONE 1 MG PO TABS: 1.0000 mg | ORAL_TABLET | Freq: Every day | ORAL | 0 refills | Status: DC

## 2023-03-04 MED ORDER — DOXYCYCLINE HYCLATE 100 MG PO TABS: 100.0000 mg | ORAL_TABLET | Freq: Two times a day (BID) | ORAL | 0 refills | Status: AC

## 2023-03-04 NOTE — Assessment & Plan Note (Signed)
Needed refill on medication.  I provided medication refill since PCP is out of office today.  I will go ahead and encouraged her to follow-up with PCP

## 2023-03-04 NOTE — Progress Notes (Signed)
Established patient visit   Patient: Heather Haas   DOB: 09-01-1976   47 y.o. Female  MRN: [id number] Visit Date: 03/04/2023  Today's healthcare provider: Charlton Amor, DO   Chief Complaint  Patient presents with   Rash    Pt states she pulled a tick off her leg 2 days ago and noticed a rash on her lower left arm today.    SUBJECTIVE    Chief Complaint  Patient presents with   Rash    Pt states she pulled a tick off her leg 2 days ago and noticed a rash on her lower left arm today.   HPI   Pt presents for rash on her arm well as tick bite in her pelvic area.  She said the rash started a couple days ago when she was out working in the yard.  Admits to pruritus.  Denies any fever, chills, shortness of breath. tick bite started yesterday.  She was able to get the tick but she did not save it.   Review of Systems  Constitutional:  Negative for activity change, fatigue and fever.  Respiratory:  Negative for cough and shortness of breath.   Cardiovascular:  Negative for chest pain.  Gastrointestinal:  Negative for abdominal pain.  Genitourinary:  Negative for difficulty urinating.  Skin:  Positive for rash.       Current Meds  Medication Sig   doxycycline (VIBRA-TABS) 100 MG tablet Take 1 tablet (100 mg total) by mouth 2 (two) times daily for 7 days.   risperiDONE (RISPERDAL) 1 MG tablet Take 1 tablet (1 mg total) by mouth at bedtime. APPT FOR FURTHER REFILLS   sertraline (ZOLOFT) 50 MG tablet Take 1 tablet (50 mg total) by mouth daily. APPT FOR FURTHER REFILLS   triamcinolone cream (KENALOG) 0.5 % Apply 1 Application topically 2 (two) times daily. To affected areas.    OBJECTIVE    BP 100/60 (BP Location: Right Arm, Patient Position: Sitting, Cuff Size: Normal)   Pulse 67   Resp 18   Ht 5\' 7"  (1.702 m)   Wt 147 lb 12 oz (67 kg)   SpO2 98%   BMI 23.14 kg/m   Physical Exam Vitals and nursing note reviewed.  Constitutional:      General: She is not in acute  distress.    Appearance: Normal appearance.  HENT:     Head: Normocephalic and atraumatic.     Right Ear: External ear normal.     Left Ear: External ear normal.     Nose: Nose normal.  Eyes:     Conjunctiva/sclera: Conjunctivae normal.  Cardiovascular:     Rate and Rhythm: Normal rate and regular rhythm.  Pulmonary:     Effort: Pulmonary effort is normal.     Breath sounds: Normal breath sounds.  Genitourinary:    Comments: Chaperone present in the room during examination.  Patient does have a small red fluctuant lesion along inguinal region.  It is red but not warm to touch. Skin:    Findings: Rash present.     Comments: Rash appreciated on left inner arm.  Small red lesions scattered throughout  Neurological:     General: No focal deficit present.     Mental Status: She is alert and oriented to person, place, and time.  Psychiatric:        Mood and Affect: Mood normal.        Behavior: Behavior normal.  Thought Content: Thought content normal.        Judgment: Judgment normal.        ASSESSMENT & PLAN    Problem List Items Addressed This Visit       Musculoskeletal and Integument   Tick bite of pelvic region - Primary    Tick bite noted in pelvic region on the right inguinal area.  Some redness appreciated and some fluctuance noted.  Will go ahead and treat with doxycycline.  This will help folliculitis versus tick bite due to Lyme disease which would be the best for further antibacterial and spirochete coverage.      Relevant Medications   doxycycline (VIBRA-TABS) 100 MG tablet   Irritant contact dermatitis    Rash located on left forearm looks mainly like a contact dermatitis and patient notes some pruritus.  Will go ahead and send in triamcinolone cream to help with irritation and itching.      Relevant Medications   triamcinolone cream (KENALOG) 0.5 %     Other   Paranoid schizophrenia ([facility])    Needed refill on medication.  I provided medication refill  since PCP is out of office today.  I will go ahead and encouraged her to follow-up with PCP      Relevant Medications   risperiDONE (RISPERDAL) 1 MG tablet   sertraline (ZOLOFT) 50 MG tablet   Auditory hallucinations    Patient said she needs refill on medicines.  I provided her a 30-day medication refill and told her to follow-up with PCP for further medication refills.      Relevant Medications   risperiDONE (RISPERDAL) 1 MG tablet   sertraline (ZOLOFT) 50 MG tablet    Return for follow up with PCP within the month to discuss chronic medical management.      Meds ordered this encounter  Medications   risperiDONE (RISPERDAL) 1 MG tablet    Sig: Take 1 tablet (1 mg total) by mouth at bedtime. APPT FOR FURTHER REFILLS    Dispense:  30 tablet    Refill:  0   sertraline (ZOLOFT) 50 MG tablet    Sig: Take 1 tablet (50 mg total) by mouth daily. APPT FOR FURTHER REFILLS    Dispense:  30 tablet    Refill:  0   doxycycline (VIBRA-TABS) 100 MG tablet    Sig: Take 1 tablet (100 mg total) by mouth 2 (two) times daily for 7 days.    Dispense:  14 tablet    Refill:  0   triamcinolone cream (KENALOG) 0.5 %    Sig: Apply 1 Application topically 2 (two) times daily. To affected areas.    Dispense:  30 g    Refill:  3    No orders of the defined types were placed in this encounter.    Charlton Amor, DO  [facility] at [facility] [phone] (phone) [phone] (fax)  [facility]

## 2023-03-04 NOTE — Assessment & Plan Note (Signed)
Patient said she needs refill on medicines.  I provided her a 30-day medication refill and told her to follow-up with PCP for further medication refills.

## 2023-03-04 NOTE — Assessment & Plan Note (Signed)
Rash located on left forearm looks mainly like a contact dermatitis and patient notes some pruritus.  Will go ahead and send in triamcinolone cream to help with irritation and itching.

## 2023-03-04 NOTE — Assessment & Plan Note (Signed)
Tick bite noted in pelvic region on the right inguinal area.  Some redness appreciated and some fluctuance noted.  Will go ahead and treat with doxycycline.  This will help folliculitis versus tick bite due to Lyme disease which would be the best for further antibacterial and spirochete coverage.

## 2023-03-27 ENCOUNTER — Other Ambulatory Visit: Payer: Self-pay | Admitting: Family Medicine

## 2023-03-27 DIAGNOSIS — R440 Auditory hallucinations: Secondary | ICD-10-CM

## 2023-03-27 DIAGNOSIS — F200 Paranoid schizophrenia: Secondary | ICD-10-CM

## 2023-04-05 ENCOUNTER — Ambulatory Visit: Payer: BC Managed Care – PPO | Admitting: Physician Assistant

## 2023-07-01 ENCOUNTER — Other Ambulatory Visit: Payer: Self-pay | Admitting: Physician Assistant

## 2023-07-01 DIAGNOSIS — F200 Paranoid schizophrenia: Secondary | ICD-10-CM

## 2023-07-01 DIAGNOSIS — R440 Auditory hallucinations: Secondary | ICD-10-CM

## 2023-11-11 ENCOUNTER — Encounter: Payer: 59 | Admitting: Physician Assistant

## 2023-11-11 ENCOUNTER — Encounter: Payer: Self-pay | Admitting: Physician Assistant

## 2023-11-11 ENCOUNTER — Ambulatory Visit (INDEPENDENT_AMBULATORY_CARE_PROVIDER_SITE_OTHER): Payer: 59 | Admitting: Physician Assistant

## 2023-11-11 VITALS — BP 95/61 | HR 56 | Ht 67.0 in | Wt 142.8 lb

## 2023-11-11 DIAGNOSIS — Z1231 Encounter for screening mammogram for malignant neoplasm of breast: Secondary | ICD-10-CM | POA: Diagnosis not present

## 2023-11-11 DIAGNOSIS — R440 Auditory hallucinations: Secondary | ICD-10-CM

## 2023-11-11 DIAGNOSIS — R413 Other amnesia: Secondary | ICD-10-CM | POA: Diagnosis not present

## 2023-11-11 DIAGNOSIS — F200 Paranoid schizophrenia: Secondary | ICD-10-CM

## 2023-11-11 DIAGNOSIS — Z0000 Encounter for general adult medical examination without abnormal findings: Secondary | ICD-10-CM

## 2023-11-11 DIAGNOSIS — Z1211 Encounter for screening for malignant neoplasm of colon: Secondary | ICD-10-CM

## 2023-11-11 DIAGNOSIS — Z131 Encounter for screening for diabetes mellitus: Secondary | ICD-10-CM

## 2023-11-11 DIAGNOSIS — Z13220 Encounter for screening for lipoid disorders: Secondary | ICD-10-CM

## 2023-11-11 MED ORDER — RISPERIDONE 1 MG PO TABS: 1.0000 mg | ORAL_TABLET | Freq: Every day | ORAL | 3 refills | Status: DC

## 2023-11-11 MED ORDER — SERTRALINE HCL 50 MG PO TABS
50.0000 mg | ORAL_TABLET | Freq: Every day | ORAL | 3 refills | Status: DC
Start: 2023-11-11 — End: 2024-07-27

## 2023-11-11 NOTE — Progress Notes (Signed)
 Complete physical exam  Patient: Heather Haas   DOB: 04/23/1976   48 y.o. Female  MRN: [id number]  Subjective:    Chief Complaint  Patient presents with   Annual Exam    Shamone Hollinger is a 48 y.o. female who presents today for a complete physical exam. She reports consuming a general diet. The patient is exercising regularly. She generally feels fairly well. She reports sleeping poorly. She does have additional problems to discuss today.   HPI:  She is having flank pain that runs across her abdomen and stomach pains after she eats occasionally. Her stools are normal, denies blood in the stool. Denies changes in stool pattern or frequency. She states her abdominal pain is located in her upper abdomen sometimes after she eats. Denies nausea or vomiting. Over the past year, she states this pain occurs 1-2/month.   She has had trouble falling asleep. She was taking Magnesium but was only taking it when she couldn't sleep. She is still taking risperidone 1mg  tablets. She endorses using melatonin as well for sleep. She states it has helped her.   She is concerned about her memory. She is having trouble memory little details like her clock in and out time. She is fine with long term memory.   Most recent fall risk assessment:    11/11/2023    9:55 AM  Fall Risk   Falls in the past year? 0  Number falls in past yr: 0  Injury with Fall? 0  Risk for fall due to : No Fall Risks  Follow up Falls evaluation completed   Most recent depression screenings:    11/11/2023    9:55 AM 03/04/2023   12:01 PM  PHQ 2/9 Scores  PHQ - 2 Score 0 0   Patient Active Problem List   Diagnosis Date Noted   Memory changes 11/11/2023   Tick bite of pelvic region 03/04/2023   Irritant contact dermatitis 03/04/2023   Uterine fibroid 04/30/2021   Bilateral lower abdominal cramping 03/04/2021   Blue nevus 03/21/2019   Dizziness 03/21/2019   Hypotension 03/21/2019   Tingling of skin 06/28/2017   Paranoid  schizophrenia (HCC) 09/14/2016   Auditory hallucinations 09/14/2016   Eosinophilia 03/29/2016   Low iron stores 03/29/2016   No energy 03/29/2016   Myalgia 03/29/2016   Multiple joint pain 03/29/2016   RLS (restless legs syndrome) 03/29/2016   Lesion of palate 01/28/2016   Dermatitis 07/08/2015   Bilateral hand numbness 01/04/2014   Cervical spondylosis 01/02/2014   Post partum depression 02/25/2013   Past Medical History:  Diagnosis Date   Anxiety    Depression    Vaginal Pap smear, abnormal    Past Surgical History:  Procedure Laterality Date   cervical cryo     GYNECOLOGIC CRYOSURGERY     Social History   Tobacco Use   Smoking status: Never   Smokeless tobacco: Never  Vaping Use   Vaping status: Never Used  Substance Use Topics   Alcohol use: No   Drug use: No   Family History  Problem Relation Age of Onset   Alcohol abuse Father    Alcohol abuse Brother    Cancer Maternal Aunt    Diabetes Maternal Uncle    Stroke Maternal Grandmother    Diabetes Paternal Grandmother    Allergies  Allergen Reactions   Diflucan [Fluconazole]     RASH.    Penicillins     Outpatient Medications Prior to Visit  Medication Sig  triamcinolone cream (KENALOG) 0.5 % Apply 1 Application topically 2 (two) times daily. To affected areas.   [DISCONTINUED] risperiDONE (RISPERDAL) 1 MG tablet TAKE 1 TABLET (1 MG TOTAL) BY MOUTH AT BEDTIME. APPT FOR FURTHER REFILLS   [DISCONTINUED] sertraline (ZOLOFT) 50 MG tablet TAKE 1 TABLET (50 MG TOTAL) BY MOUTH DAILY. APPT FOR FURTHER REFILLS   [DISCONTINUED] nitrofurantoin, macrocrystal-monohydrate, (MACROBID) 100 MG capsule Take 1 capsule (100 mg total) by mouth 2 (two) times daily. (Patient not taking: Reported on 11/11/2023)   No facility-administered medications prior to visit.    Review of Systems  Constitutional:  Negative for chills, fever and weight loss.  HENT: Negative.    Eyes: Negative.   Respiratory: Negative.     Cardiovascular: Negative.   Gastrointestinal:  Positive for abdominal pain, heartburn and nausea. Negative for blood in stool, constipation, diarrhea and melena.  Genitourinary:  Negative for flank pain.  Skin: Negative.   Neurological: Negative.   Endo/Heme/Allergies: Negative.   Psychiatric/Behavioral:  The patient has insomnia.   All other systems reviewed and are negative.     Objective:     BP 95/61 (BP Location: Left Arm, Patient Position: Sitting, Cuff Size: Normal)   Pulse (!) 56   Ht 5\' 7"  (1.702 m)   Wt 142 lb 12 oz (64.8 kg)   SpO2 98%   BMI 22.36 kg/m  BP Readings from Last 3 Encounters:  11/11/23 95/61  03/04/23 100/60  12/09/21 (!) 103/51   Wt Readings from Last 3 Encounters:  11/11/23 142 lb 12 oz (64.8 kg)  03/04/23 147 lb 12 oz (67 kg)  12/09/21 153 lb (69.4 kg)      Physical Exam Constitutional:      Appearance: Normal appearance.  HENT:     Head: Normocephalic and atraumatic.     Right Ear: Tympanic membrane normal.     Left Ear: Tympanic membrane normal.     Nose: Nose normal.     Mouth/Throat:     Mouth: Mucous membranes are moist.     Pharynx: Oropharynx is clear.  Eyes:     Extraocular Movements: Extraocular movements intact.  Cardiovascular:     Rate and Rhythm: Normal rate and regular rhythm.     Pulses: Normal pulses.     Heart sounds: Normal heart sounds.  Pulmonary:     Effort: Pulmonary effort is normal.     Breath sounds: Normal breath sounds.  Abdominal:     General: Abdomen is flat. Bowel sounds are normal. There is no distension.     Palpations: Abdomen is soft. There is no mass.     Tenderness: There is no abdominal tenderness. There is no guarding.  Musculoskeletal:        General: Normal range of motion.     Cervical back: Normal range of motion.     Comments: Hip flexor pain  Skin:    General: Skin is warm.  Neurological:     Mental Status: She is alert.  Psychiatric:        Mood and Affect: Mood normal.      Assessment & Plan:    Routine Health Maintenance and Physical Exam  Immunization History  Administered Date(s) Administered   Tdap 03/21/2019   Health Maintenance  Topic Date Due   Colonoscopy  Never done   MAMMOGRAM  04/30/2022   INFLUENZA VACCINE  Never done   COVID-19 Vaccine (1) 11/27/2023 (Originally 02/20/1981)   HIV Screening  11/10/2024 (Originally 02/21/1991)   Cervical Cancer Screening (  HPV/Pap Cotest)  06/25/2026   DTaP/Tdap/Td (2 - Td or Tdap) 03/20/2029   Hepatitis C Screening  Completed   HPV VACCINES  Aged Out    Discussed health benefits of physical activity, and encouraged her to engage in regular exercise appropriate for her age and condition. ..Shametra was seen today for annual exam.  Diagnoses and all orders for this visit:  Routine physical examination -     Lipid panel -     MM 3D SCREENING MAMMOGRAM BILATERAL BREAST; Future -     Ambulatory referral to Gastroenterology -     B12 and Folate Panel -     VITAMIN D 25 Hydroxy (Vit-D Deficiency, Fractures) -     CMP14+EGFR -     TSH + free T4 -     CBC w/Diff/Platelet  Auditory hallucinations -     sertraline (ZOLOFT) 50 MG tablet; Take 1 tablet (50 mg total) by mouth daily. -     risperiDONE (RISPERDAL) 1 MG tablet; Take 1 tablet (1 mg total) by mouth at bedtime.  Paranoid schizophrenia (HCC) -     sertraline (ZOLOFT) 50 MG tablet; Take 1 tablet (50 mg total) by mouth daily. -     risperiDONE (RISPERDAL) 1 MG tablet; Take 1 tablet (1 mg total) by mouth at bedtime.  Memory changes -     B12 and Folate Panel -     VITAMIN D 25 Hydroxy (Vit-D Deficiency, Fractures) -     TSH + free T4 -     CBC w/Diff/Platelet  Visit for screening mammogram -     MM 3D SCREENING MAMMOGRAM BILATERAL BREAST; Future  Colon cancer screening -     Ambulatory referral to Gastroenterology  Screening for lipid disorders -     Lipid panel  Screening for diabetes mellitus -     CMP14+EGFR    Health Maintenance:   -  Due for mammogram, ordered placed             -pap UTD  - Due for COVID, flu, HIV screening, declined today.  - Due for colonoscopy, referral placed today She states she is going to find a new dentist and has not been in a long time.  Hip flexor exercises given to patient for tightness and pain.  Patient told to increase water intake and take a daily probiotic to help with stool regularity and abdominal pain.  - Patient told to write down specific foods that cause the stomach pain.  Sleep:   - Patient told to take magnesium glycinate 250mg  for sleep. Memory-  ..    11/11/2023   10:06 AM  Montreal Cognitive Assessment   Visuospatial/ Executive (0/5) 5  Naming (0/3) 3  Attention: Read list of digits (0/2) 2  Attention: Read list of letters (0/1) 1  Attention: Serial 7 subtraction starting at 100 (0/3) 3  Language: Repeat phrase (0/2) 2  Language : Fluency (0/1) 1  Abstraction (0/2) 2  Delayed Recall (0/5) 4  Orientation (0/6) 6  Total 29   No concerns. Discussed memory strategies. HO given. Follow up in 1 year.     Tandy Gaw, PA-C

## 2023-11-11 NOTE — Patient Instructions (Addendum)
For sleep trial of magnesium 250mg  in the evening  For upper abdominal cramping trial of probiotics culturelle or PURE brand  Memory Compensation Strategies  Use "WARM" strategy.  W= write it down  A= associate it  R= repeat it  M= make a mental note  2.   You can keep a Glass blower/designer.  Use a 3-ring notebook with sections for the following: calendar, important names and phone numbers,  medications, doctors' names/phone numbers, lists/reminders, and a section to journal what you did  each day.   3.    Use a calendar to write appointments down.  4.    Write yourself a schedule for the day.  This can be placed on the calendar or in a separate section of the Memory Notebook.  Keeping a  regular schedule can help memory.  5.    Use medication organizer with sections for each day or morning/evening pills.  You may need help loading it  6.    Keep a basket, or pegboard by the door.  Place items that you need to take out with you in the basket or on the pegboard.  You may also want to  include a message board for reminders.  7.    Use sticky notes.  Place sticky notes with reminders in a place where the task is performed.  For example: " turn off the  stove" placed by the stove, "lock the door" placed on the door at eye level, " take your medications" on  the bathroom mirror or by the place where you normally take your medications.  8.    Use alarms/timers.  Use while cooking to remind yourself to check on food or as a reminder to take your medicine, or as a  reminder to make a call, or as a reminder to perform another task, etc.   Health Maintenance, Female Adopting a healthy lifestyle and getting preventive care are important in promoting health and wellness. Ask your health care provider about: The right schedule for you to have regular tests and exams. Things you can do on your own to prevent diseases and keep yourself healthy. What should I know about diet, weight, and  exercise? Eat a healthy diet  Eat a diet that includes plenty of vegetables, fruits, low-fat dairy products, and lean protein. Do not eat a lot of foods that are high in solid fats, added sugars, or sodium. Maintain a healthy weight Body mass index (BMI) is used to identify weight problems. It estimates body fat based on height and weight. Your health care provider can help determine your BMI and help you achieve or maintain a healthy weight. Get regular exercise Get regular exercise. This is one of the most important things you can do for your health. Most adults should: Exercise for at least 150 minutes each week. The exercise should increase your heart rate and make you sweat (moderate-intensity exercise). Do strengthening exercises at least twice a week. This is in addition to the moderate-intensity exercise. Spend less time sitting. Even light physical activity can be beneficial. Watch cholesterol and blood lipids Have your blood tested for lipids and cholesterol at 48 years of age, then have this test every 5 years. Have your cholesterol levels checked more often if: Your lipid or cholesterol levels are high. You are older than 48 years of age. You are at high risk for heart disease. What should I know about cancer screening? Depending on your health history and family history, you may  need to have cancer screening at various ages. This may include screening for: Breast cancer. Cervical cancer. Colorectal cancer. Skin cancer. Lung cancer. What should I know about heart disease, diabetes, and high blood pressure? Blood pressure and heart disease High blood pressure causes heart disease and increases the risk of stroke. This is more likely to develop in people who have high blood pressure readings or are overweight. Have your blood pressure checked: Every 3-5 years if you are 15-85 years of age. Every year if you are 63 years old or older. Diabetes Have regular diabetes  screenings. This checks your fasting blood sugar level. Have the screening done: Once every three years after age 76 if you are at a normal weight and have a low risk for diabetes. More often and at a younger age if you are overweight or have a high risk for diabetes. What should I know about preventing infection? Hepatitis B If you have a higher risk for hepatitis B, you should be screened for this virus. Talk with your health care provider to find out if you are at risk for hepatitis B infection. Hepatitis C Testing is recommended for: Everyone born from [birth year] through 1965. Anyone with known risk factors for hepatitis C. Sexually transmitted infections (STIs) Get screened for STIs, including gonorrhea and chlamydia, if: You are sexually active and are younger than 48 years of age. You are older than 48 years of age and your health care provider tells you that you are at risk for this type of infection. Your sexual activity has changed since you were last screened, and you are at increased risk for chlamydia or gonorrhea. Ask your health care provider if you are at risk. Ask your health care provider about whether you are at high risk for HIV. Your health care provider may recommend a prescription medicine to help prevent HIV infection. If you choose to take medicine to prevent HIV, you should first get tested for HIV. You should then be tested every 3 months for as long as you are taking the medicine. Pregnancy If you are about to stop having your period (premenopausal) and you may become pregnant, seek counseling before you get pregnant. Take 400 to 800 micrograms (mcg) of folic acid every day if you become pregnant. Ask for birth control (contraception) if you want to prevent pregnancy. Osteoporosis and menopause Osteoporosis is a disease in which the bones lose minerals and strength with aging. This can result in bone fractures. If you are 37 years old or older, or if you are at risk for  osteoporosis and fractures, ask your health care provider if you should: Be screened for bone loss. Take a calcium or vitamin D supplement to lower your risk of fractures. Be given hormone replacement therapy (HRT) to treat symptoms of menopause. Follow these instructions at home: Alcohol use Do not drink alcohol if: Your health care provider tells you not to drink. You are pregnant, may be pregnant, or are planning to become pregnant. If you drink alcohol: Limit how much you have to: 0-1 drink a day. Know how much alcohol is in your drink. In the [place] one drink equals one 12 oz bottle of beer (355 mL), one 5 oz glass of wine (148 mL), or one 1 oz glass of hard liquor (44 mL). Lifestyle Do not use any products that contain nicotine or tobacco. These products include cigarettes, chewing tobacco, and vaping devices, such as e-cigarettes. If you need help quitting, ask your health care  provider. Do not use street drugs. Do not share needles. Ask your health care provider for help if you need support or information about quitting drugs. General instructions Schedule regular health, dental, and eye exams. Stay current with your vaccines. Tell your health care provider if: You often feel depressed. You have ever been abused or do not feel safe at home. Summary Adopting a healthy lifestyle and getting preventive care are important in promoting health and wellness. Follow your health care provider's instructions about healthy diet, exercising, and getting tested or screened for diseases. Follow your health care provider's instructions on monitoring your cholesterol and blood pressure. This information is not intended to replace advice given to you by your health care provider. Make sure you discuss any questions you have with your health care provider. Document Revised: 01/26/2021 Document Reviewed: 01/26/2021 Elsevier Patient Education  2024 [employer].

## 2023-11-12 LAB — CMP14+EGFR
ALT: 31 [IU]/L (ref 0–32)
AST: 28 [IU]/L (ref 0–40)
Albumin: 4.4 g/dL (ref 3.9–4.9)
Alkaline Phosphatase: 62 [IU]/L (ref 44–121)
BUN/Creatinine Ratio: 16 (ref 9–23)
BUN: 12 mg/dL (ref 6–24)
Bilirubin Total: 0.5 mg/dL (ref 0.0–1.2)
CO2: 25 mmol/L (ref 20–29)
Calcium: 9.2 mg/dL (ref 8.7–10.2)
Chloride: 103 mmol/L (ref 96–106)
Creatinine, Ser: 0.74 mg/dL (ref 0.57–1.00)
Globulin, Total: 2.3 g/dL (ref 1.5–4.5)
Glucose: 96 mg/dL (ref 70–99)
Potassium: 4 mmol/L (ref 3.5–5.2)
Sodium: 140 mmol/L (ref 134–144)
Total Protein: 6.7 g/dL (ref 6.0–8.5)
eGFR: 100 mL/min/{1.73_m2} (ref >59)

## 2023-11-12 LAB — CBC WITH DIFFERENTIAL/PLATELET
Basophils Absolute: 0 10*3/uL (ref 0.0–0.2)
Basos: 1 %
EOS (ABSOLUTE): 0.4 10*3/uL (ref 0.0–0.4)
Eos: 6 %
Hematocrit: 40.7 % (ref 34.0–46.6)
Hemoglobin: 13.1 g/dL (ref 11.1–15.9)
Immature Grans (Abs): 0 10*3/uL (ref 0.0–0.1)
Immature Granulocytes: 0 %
Lymphocytes Absolute: 2 10*3/uL (ref 0.7–3.1)
Lymphs: 34 %
MCH: 28.7 pg (ref 26.6–33.0)
MCHC: 32.2 g/dL (ref 31.5–35.7)
MCV: 89 fL (ref 79–97)
Monocytes Absolute: 0.4 10*3/uL (ref 0.1–0.9)
Monocytes: 6 %
Neutrophils Absolute: 3.1 10*3/uL (ref 1.4–7.0)
Neutrophils: 53 %
Platelets: 327 10*3/uL (ref 150–450)
RBC: 4.56 x10E6/uL (ref 3.77–5.28)
RDW: 13 % (ref 11.7–15.4)
WBC: 5.8 10*3/uL (ref 3.4–10.8)

## 2023-11-12 LAB — LIPID PANEL
Chol/HDL Ratio: 2 {ratio} (ref 0.0–4.4)
Cholesterol, Total: 166 mg/dL (ref 100–199)
HDL: 81 mg/dL (ref >39)
LDL Chol Calc (NIH): 75 mg/dL (ref 0–99)
Triglycerides: 50 mg/dL (ref 0–149)
VLDL Cholesterol Cal: 10 mg/dL (ref 5–40)

## 2023-11-12 LAB — TSH+FREE T4
Free T4: 1.18 ng/dL (ref 0.82–1.77)
TSH: 3.31 u[IU]/mL (ref 0.450–4.500)

## 2023-11-12 LAB — B12 AND FOLATE PANEL
Folate: >20 ng/mL (ref >3.0)
Vitamin B-12: 1109 pg/mL (ref 232–1245)

## 2023-11-12 LAB — VITAMIN D 25 HYDROXY (VIT D DEFICIENCY, FRACTURES): Vit D, 25-Hydroxy: 61.5 ng/mL (ref 30.0–100.0)

## 2023-11-14 ENCOUNTER — Encounter: Payer: Self-pay | Admitting: Physician Assistant

## 2023-11-14 NOTE — Progress Notes (Signed)
 Tyleah,   Cholesterol looks fantastic.  B12 looks good.  Vitamin D looks good.  Kidney, liver, glucose look great.  Thyroid normal.  Normal hemoglobin.   Looks GREAT!

## 2024-04-12 ENCOUNTER — Telehealth: Payer: Self-pay

## 2024-04-12 ENCOUNTER — Ambulatory Visit: Payer: Self-pay

## 2024-04-12 NOTE — Telephone Encounter (Signed)
 Copied from CRM [id number]. Topic: Clinical - Red Word Triage >> Apr 12, 2024 12:31 PM Susanna ORN wrote: Red Word that prompted transfer to Nurse Triage: Patient states her right arm is red, swollen, & tender to the touch. She states she's not sure if something bit her or not. Also states that the pain feels like it's running through her arm and she's never felt that before. Patient stated this has been ongoing for about 3 weeks to a month. >> Apr 12, 2024  1:13 PM Miquel SAILOR wrote: Patient returning office call. Transferred to office

## 2024-04-12 NOTE — Telephone Encounter (Signed)
 FYI Only or Action Required?: FYI only for provider.  Patient was last seen in primary care on 11/11/2023 by Antoniette Vermell CROME, PA-C.  Called Nurse Triage reporting Arm Swelling.  Symptoms began several weeks ago.  Interventions attempted: Nothing.  Symptoms are: gradually worsening.  Triage Disposition: See Physician Within 24 Hours  Patient/caregiver understands and will follow disposition?: Yes      Copied from CRM #[id number]. Topic: Clinical - Red Word Triage >> Apr 12, 2024 12:31 PM Heather Haas wrote: Red Word that prompted transfer to Nurse Triage: Patient states her right arm is red, swollen, & tender to the touch. She states she's not sure if something bit her or not. Also states that the pain feels like it's running through her arm and she's never felt that before. Patient stated this has been ongoing for about 3 weeks to a month. Reason for Disposition  [1] Localized rash is very painful AND [2] no fever  Answer Assessment - Initial Assessment Questions 1. ONSET: When did the pain start?     3 wks 2. LOCATION: Where is the pain located?     Right arm by elbow 3. PAIN: How bad is the pain? (Scale 0-10; or none, mild, moderate, severe)     3/10 4. WORK OR EXERCISE: Has there been any recent work or exercise that involved this part of the body?     Does do exercising 5. CAUSE: What do you think is causing the arm pain?     unknown 6. OTHER SYMPTOMS: Do you have any other symptoms? (e.g., neck pain, swelling, rash, fever, numbness, weakness)     Slight Swelling, red circle by the elbow  Protocols used: Arm Pain-A-AH

## 2024-04-12 NOTE — Telephone Encounter (Signed)
 Attempted call to patient. Left a voice mail message requesting a return call.  Just have seen that patient was seen at urgent care for issue today.

## 2024-04-20 ENCOUNTER — Ambulatory Visit: Payer: Self-pay

## 2024-04-20 NOTE — Telephone Encounter (Signed)
 I would recommend that she go back to urgent care especially since it was related to an infection in her arm and since we could not get her in today.

## 2024-04-20 NOTE — Telephone Encounter (Signed)
 Patient has been notified of the provider's recommendation. Agreeable with plan treatment. Patient will keep the upcoming appointment to follow up.

## 2024-04-20 NOTE — Telephone Encounter (Signed)
 FYI Only or Action Required?: Action required by provider: request for appointment.  Patient was last seen in primary care on 11/11/2023 by Antoniette Vermell CROME, PA-C.  Called Nurse Triage reporting Arm Pain.  Symptoms began several weeks ago.  Interventions attempted: Prescription medications: unknown.  Symptoms are: gradually improving.  Triage Disposition: See PCP When Office is Open (Within 3 Days)  Patient/caregiver understands and will follow disposition?: YesCopied from CRM [id number]. Topic: Clinical - Red Word Triage >> Apr 20, 2024  2:19 PM Zane F wrote: Red Word that prompted transfer to Nurse Triage:   Last Friday patient went into the urgent care for an infection in her right arm (cellulitis) ; right arm is now in pain Reason for Disposition  [1] MODERATE pain (e.g., interferes with normal activities) AND [2] present > 3 days  Answer Assessment - Initial Assessment Questions Started with infection  3-4 weeks before seeking care at [facility]. Pt thought it was sore muscle. Pt was treated for cellulitis. Pt finished medication yesterday. Arm is in pain. Hurts to lift objects    1. ONSET: When did the pain start?     Over a month ago 2. LOCATION: Where is the pain located?     Right arm 3. PAIN: How bad is the pain? (Scale 0-10; or none, mild, moderate, severe)     8 4. WORK OR EXERCISE: Has there been any recent work or exercise that involved this part of the body?     Lifting for work 5. CAUSE: What do you think is causing the arm pain?     Infection  6. OTHER SYMPTOMS: Do you have any other symptoms? (e.g., neck pain, swelling, rash, fever, numbness, weakness)     Denies all  Protocols used: Arm Pain-A-AH

## 2024-04-23 ENCOUNTER — Encounter: Payer: Self-pay | Admitting: Medical-Surgical

## 2024-04-23 ENCOUNTER — Ambulatory Visit (INDEPENDENT_AMBULATORY_CARE_PROVIDER_SITE_OTHER)

## 2024-04-23 ENCOUNTER — Ambulatory Visit (INDEPENDENT_AMBULATORY_CARE_PROVIDER_SITE_OTHER): Admitting: Medical-Surgical

## 2024-04-23 VITALS — BP 90/52 | HR 63 | Resp 20 | Ht 67.0 in | Wt 140.1 lb

## 2024-04-23 DIAGNOSIS — M5412 Radiculopathy, cervical region: Secondary | ICD-10-CM | POA: Diagnosis not present

## 2024-04-23 DIAGNOSIS — M25521 Pain in right elbow: Secondary | ICD-10-CM | POA: Diagnosis not present

## 2024-04-23 MED ORDER — MELOXICAM 15 MG PO TABS: 15.0000 mg | ORAL_TABLET | Freq: Every day | ORAL | 0 refills | Status: DC

## 2024-04-23 MED ORDER — PREDNISONE 50 MG PO TABS: 50.0000 mg | ORAL_TABLET | Freq: Every day | ORAL | 0 refills | Status: DC

## 2024-04-23 NOTE — Progress Notes (Signed)
 Established patient visit   History of Present Illness   Discussed the use of AI scribe software for clinical note transcription with the patient, who gave verbal consent to proceed.  History of Present Illness   Heather Haas is a 48 year old female who presents with persistent right arm pain and swelling.  She has experienced right arm pain and swelling for three to four weeks before she went to urgent care. Initially noticed a red, swollen spot. Despite completing a course of antibiotics prescribed during her urgent care visit, the pain persists and has spread throughout her arm. A second trip to urgent care did not provide much insight although they thought her pain was part of an overuse syndrome. The pain is aching, worsens with lifting, and is concentrated in a tender spot with a previous dark circle. Warmth and tenderness have improved since starting antibiotics.  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 She has been in this role for seven to eight months and is right-handed. She attempts to use her left arm more to reduce strain on her right arm.  There is no recent numbness or tingling in her hands or fingers, though she experienced some in the past. Sleep is generally unaffected, but she occasionally adjusts her hand position due to discomfort. She sometimes forgets to take her pain medication (ibuprofen) which may affect her symptoms.  She occasionally experiences neck pain, possibly from sleeping awkwardly, but is unsure if it relates to her arm pain.      Physical Exam   Physical Exam Vitals reviewed.  Constitutional:      General: She is not in acute distress.    Appearance: Normal appearance.  HENT:     Head: Normocephalic and atraumatic.  Cardiovascular:     Rate and Rhythm: Normal rate and regular rhythm.     Pulses: Normal pulses.     Heart sounds: Normal heart sounds. No murmur heard.    No  friction rub. No gallop.  Pulmonary:     Effort: Pulmonary effort is normal. No respiratory distress.     Breath sounds: Normal breath sounds. No wheezing.  Musculoskeletal:     Right shoulder: Normal.     Right elbow: Tenderness present.  Skin:    General: Skin is warm and dry.  Neurological:     Mental Status: She is alert and oriented to person, place, and time.  Psychiatric:        Mood and Affect: Mood normal.        Behavior: Behavior normal.        Thought Content: Thought content normal.        Judgment: Judgment normal.     Assessment & Plan   Assessment and Plan    Cervical radiculopathy with right arm pain Chronic right arm pain with swelling and redness, likely cervical radiculopathy. Symptoms suggest nerve irritation from cervical spine. No numbness or tingling in hands. Neck strain likely from repetitive work and poor posture. - Prescribed prednisone  5-day course, once daily with breakfast. - Prescribed meloxicam  to start after prednisone , once daily. - Advised against other NSAIDs; Tylenol permissible. - Provided neck exercises for 4-6 weeks. - Recommended rest and work activity modification. - Order x-rays of neck and right elbow if symptoms persist. - Consider referral to sports medicine orthopedic specialist if no improvement after 4-6 weeks.      Follow up   Return if  symptoms worsen or fail to improve.  __________________________________ Zada FREDRIK Palin, DNP, APRN, FNP-BC Primary Care and Sports Medicine [facility]

## 2024-04-24 ENCOUNTER — Telehealth: Payer: Self-pay

## 2024-04-24 NOTE — Telephone Encounter (Signed)
 Attempted call to patient. Left a detailed voice mail message on patient home # - left return contact information should patient have any questions.

## 2024-04-24 NOTE — Telephone Encounter (Signed)
 Spoke with patient - informed  to complete prednisone  and then start Meloxicam  . And can take Meloxicam  day or night but most people prefer nighttime.

## 2024-04-24 NOTE — Telephone Encounter (Signed)
 Copied from CRM #[id number]. Topic: Clinical - Medication Question >> Apr 24, 2024  9:21 AM Kevelyn M wrote: Reason for CRM: Patient calling about when to take meloxicam , she's asking when she is supposed to take it. Asking if she is supposed to take it at night or with other medication in the morning. Call back # [phone]

## 2024-05-02 ENCOUNTER — Ambulatory Visit: Payer: Self-pay | Admitting: Medical-Surgical

## 2024-05-07 ENCOUNTER — Telehealth: Payer: Self-pay

## 2024-05-07 NOTE — Telephone Encounter (Signed)
 Copied from CRM #[id number]. Topic: General - Other >> May 04, 2024  3:04 PM Miquel SAILOR wrote: Reason for CRM: DG Elbow Complete Right (Accession [id number]) (Order [id number]) DG Cervical Spine Complete (Accession [id number]) (Order [id number])  Patient calling on update for lab results from 08/04 could not get into Mychart sent reset password to email. Needs call back on results. Did let PT know after 3pm she will get a call back on Monday [phone]

## 2024-05-07 NOTE — Telephone Encounter (Signed)
 Attempted call to patient. Left a voice mail message requesting a return call.   No lab results showing drawn on 04/23/2024 but there are x-ray results in patient chart - wondering if this could be what she is requesting

## 2024-05-08 NOTE — Telephone Encounter (Signed)
 Parthenia LITTIE Nap, CMA 05/07/2024  3:44 PM EDT     Patient has been advised of results/recommendations. Verbalized understanding. Per patient, she continues to have discomfort in her elbow.

## 2024-05-11 ENCOUNTER — Ambulatory Visit: Payer: 59 | Admitting: Physician Assistant

## 2024-07-13 ENCOUNTER — Ambulatory Visit: Admitting: Physician Assistant

## 2024-07-27 ENCOUNTER — Ambulatory Visit: Admitting: Physician Assistant

## 2024-07-27 VITALS — BP 92/46 | HR 60 | Ht 67.0 in | Wt 140.0 lb

## 2024-07-27 DIAGNOSIS — R440 Auditory hallucinations: Secondary | ICD-10-CM

## 2024-07-27 DIAGNOSIS — R413 Other amnesia: Secondary | ICD-10-CM | POA: Diagnosis not present

## 2024-07-27 DIAGNOSIS — F200 Paranoid schizophrenia: Secondary | ICD-10-CM

## 2024-07-27 DIAGNOSIS — M25521 Pain in right elbow: Secondary | ICD-10-CM | POA: Diagnosis not present

## 2024-07-27 DIAGNOSIS — Z23 Encounter for immunization: Secondary | ICD-10-CM

## 2024-07-27 DIAGNOSIS — R1030 Lower abdominal pain, unspecified: Secondary | ICD-10-CM

## 2024-07-27 DIAGNOSIS — Z1231 Encounter for screening mammogram for malignant neoplasm of breast: Secondary | ICD-10-CM

## 2024-07-27 LAB — POCT URINALYSIS DIP (CLINITEK)
Bilirubin, UA: NEGATIVE
Blood, UA: NEGATIVE
Glucose, UA: NEGATIVE mg/dL
Leukocytes, UA: NEGATIVE
Nitrite, UA: NEGATIVE
POC PROTEIN,UA: NEGATIVE
Spec Grav, UA: 1.02 (ref 1.010–1.025)
Urobilinogen, UA: 0.2 U/dL
pH, UA: 7 (ref 5.0–8.0)

## 2024-07-27 MED ORDER — DICLOFENAC SODIUM 1 % EX GEL: 4.0000 g | Freq: Four times a day (QID) | CUTANEOUS | 1 refills | Status: AC

## 2024-07-27 NOTE — Patient Instructions (Addendum)
 Voltaren as needed over right elbow Probiotics for next month BLand diet Will submit for psychology evaluation for memory  Viral Gastroenteritis, Adult  Viral gastroenteritis is also known as the stomach flu. This condition may affect your stomach, your small intestine, and your large intestine. It can cause sudden watery poop (diarrhea), fever, and vomiting. This condition is caused by certain germs (viruses). These germs can be passed from person to person very easily (are contagious). Having watery poop and vomiting can make you feel weak and cause you to not have enough water in your body (get dehydrated). This can make you tired and thirsty, make you have a dry mouth, and make it so you pee (urinate) less often. It is important to replace the fluids that you lose from having watery poop and vomiting. What are the causes? You can get sick by catching germs from other people. You can also get sick by: Eating food, drinking water, or touching a surface that has the germs on it (is contaminated). Sharing utensils or other personal items with a person who is sick. What increases the risk? Having a weak body defense system (immune system). Living with one or more children who are younger than 2 years. Living in a nursing home. Going on cruise ships. What are the signs or symptoms? Symptoms of this condition start suddenly. Symptoms may last for a few days or for as long as a week. Common symptoms include: Watery poop. Vomiting. Other symptoms include: Fever. Headache. Feeling tired (fatigue). Pain in the belly (abdomen). Chills. Feeling weak. Feeling like you may vomit (nauseous). Muscle aches. Not feeling hungry. How is this treated? This condition typically goes away on its own. The focus of treatment is to replace the fluids that you lose. This condition may be treated with: An ORS (oral rehydration solution). This is a drink that helps you replace fluids and minerals your body  lost. It is sold at pharmacies and stores. Medicines to help with your symptoms. Probiotic supplements to reduce symptoms of watery poop. Fluids given through an IV tube, if needed. Older adults and people with other diseases or a weak body defense system are at higher risk for not having enough water in the body. Follow these instructions at home: Eating and drinking  Take an ORS as told by your doctor. Drink clear fluids in small amounts as you are able. Clear fluids include: Water. Ice chips. Fruit juice that has water added to it (is diluted). Low-calorie sports drinks. Drink enough fluid to keep your pee (urine) pale yellow. Eat small amounts of healthy foods every 3-4 hours as you are able. This may include whole grains, fruits, vegetables, lean meats, and yogurt. Avoid fluids that have a lot of sugar or caffeine in them. This includes energy drinks, sports drinks, and soda. Avoid spicy or fatty foods. Avoid alcohol. General instructions  Wash your hands often. This is very important after you have watery poop or you vomit. If you cannot use soap and water, use hand sanitizer. Make sure that all people in your home wash their hands well and often. Take over-the-counter and prescription medicines only as told by your doctor. Rest at home while you get better. Watch your condition for any changes. Take a warm bath to help with any burning or pain from having watery poop. Keep all follow-up visits. Contact a doctor if: You cannot keep fluids down. Your symptoms get worse. You have new symptoms. You feel light-headed or dizzy. You have muscle  cramps. Get help right away if: You have chest pain. You have trouble breathing, or you are breathing very fast. You have a fast heartbeat. You feel very weak or you faint. You have a very bad headache, a stiff neck, or both. You have a rash. You have very bad pain, cramping, or bloating in your belly. Your skin feels cold and  clammy. You feel mixed up (confused). You have pain when you pee. You have signs of not having enough water in the body, such as: Dark pee, hardly any pee, or no pee. Cracked lips. Dry mouth. Sunken eyes. Feeling very sleepy. Feeling weak. You have signs of bleeding, such as: You see blood in your vomit. Your vomit looks like coffee grounds. You have bloody or black poop or poop that looks like tar. These symptoms may be an emergency. Get help right away. Call 911. Do not wait to see if the symptoms will go away. Do not drive yourself to the hospital. Summary Viral gastroenteritis is also known as the stomach flu. This condition can cause sudden watery poop (diarrhea), fever, and vomiting. These germs can be passed from person to person very easily. Take an ORS (oral rehydration solution) as told by your doctor. This is a drink that is sold at pharmacies and stores. Wash your hands often, especially after having watery poop or vomiting. If you cannot use soap and water, use hand sanitizer. This information is not intended to replace advice given to you by your health care provider. Make sure you discuss any questions you have with your health care provider. Document Revised: 07/06/2021 Document Reviewed: 07/06/2021 Elsevier Patient Education  2024 [employer].

## 2024-07-27 NOTE — Progress Notes (Signed)
 Established Patient Office Visit  Subjective   Patient ID: Heather Haas, female    DOB: [birth year]  Age: 48 y.o. MRN: [id number]  Chief Complaint  Patient presents with   Medical Management of Chronic Issues    HPI .SABRADiscussed the use of AI scribe software for clinical note transcription with the patient, who gave verbal consent to proceed.  History of Present Illness Heather Haas is a 48 year old female who presents with concerns about memory issues and abdominal pain.  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 Intermittent.  - No pain with palpation - Normal bowel movements without straining - Recent consumption of spicy food - No reflux symptoms - Not tried anything to make better.   Upper extremity pain - Intermittent pain in the arm, with occasional discomfort in the evenings - History of previous episode with arm swelling and discoloration, treated with antibiotics - Initial pain was spreading but has since improved     ROS See HPI.    Objective:     BP (!) 92/46   Pulse 60   Ht 5' 7 (1.702 m)   Wt 140 lb (63.5 kg)   SpO2 99%   BMI 21.93 kg/m  BP Readings from Last 3 Encounters:  07/27/24 (!) 92/46  04/23/24 (!) 90/52  11/11/23 95/61   Wt Readings from Last 3 Encounters:  07/27/24 140 lb (63.5 kg)  04/23/24 140 lb 1.9 oz (63.6 kg)  11/11/23 142 lb 12 oz (64.8 kg)    .SABRA Results for orders placed or performed in visit on 07/27/24   POCT URINALYSIS DIP (CLINITEK)   Collection Time: 07/27/24 10:23 AM  Result Value Ref Range   Color, UA yellow yellow   Clarity, UA clear clear   Glucose, UA negative negative mg/dL   Bilirubin, UA negative negative   Ketones, POC UA small (15) (A) negative mg/dL   Spec Grav, UA 8.979 8.989 - 1.025   Blood, UA negative negative   pH, UA 7.0 5.0 - 8.0   POC PROTEIN,UA negative negative, trace   Urobilinogen, UA 0.2 0.2 or 1.0 E.U./dL   Nitrite, UA Negative Negative   Leukocytes, UA Negative Negative  CBC w/Diff/Platelet   Collection Time: 07/27/24 10:41 AM  Result Value Ref Range   WBC 5.9 3.4 - 10.8 x10E3/uL   RBC 4.62 3.77 - 5.28 x10E6/uL   Hemoglobin 13.5 11.1 - 15.9 g/dL   Hematocrit 58.3 65.9 - 46.6 %   MCV 90 79 - 97 fL   MCH 29.2 26.6 - 33.0 pg   MCHC 32.5 31.5 - 35.7 g/dL   RDW 87.3 88.2 - 84.5 %   Platelets 312 150 - 450 x10E3/uL   Neutrophils 54 Not Estab. %   Lymphs 31 Not Estab. %   Monocytes 7 Not Estab. %   Eos 7 Not Estab. %   Basos 1 Not Estab. %   Neutrophils Absolute 3.2 1.4 - 7.0 x10E3/uL   Lymphocytes Absolute 1.8 0.7 - 3.1 x10E3/uL  Monocytes Absolute 0.4 0.1 - 0.9 x10E3/uL   EOS (ABSOLUTE) 0.4 0.0 - 0.4 x10E3/uL   Basophils Absolute 0.0 0.0 - 0.2 x10E3/uL   Immature Granulocytes 0 Not Estab. %   Immature Grans (Abs) 0.0 0.0 - 0.1 x10E3/uL  Lipase   Collection Time: 07/27/24 10:41 AM  Result Value Ref Range   Lipase 26 14 - 72 U/L  CMP14+EGFR   Collection Time: 07/27/24 10:41 AM  Result Value Ref Range   Glucose 88 70 - 99 mg/dL   BUN 13 6 - 24 mg/dL   Creatinine, Ser 9.27 0.57 - 1.00 mg/dL   eGFR 896 >40 fO/fpw/8.26   BUN/Creatinine Ratio 18 9 - 23   Sodium 137 134 - 144 mmol/L   Potassium 4.1 3.5 - 5.2 mmol/L   Chloride 103 96 - 106 mmol/L   CO2 21 20 - 29 mmol/L   Calcium 9.1 8.7 - 10.2 mg/dL   Total Protein 6.5 6.0 - 8.5 g/dL   Albumin 4.3 3.9 - 4.9 g/dL   Globulin, Total 2.2 1.5 - 4.5 g/dL   Bilirubin Total 0.7 0.0 - 1.2 mg/dL    Alkaline Phosphatase 51 41 - 116 IU/L   AST 57 (H) 0 - 40 IU/L   ALT 25 0 - 32 IU/L  B12 and Folate Panel   Collection Time: 07/27/24 10:41 AM  Result Value Ref Range   Vitamin B-12 929 232 - 1,245 pg/mL   Folate >20.0 >3.0 ng/mL  VITAMIN D  25 Hydroxy (Vit-D Deficiency, Fractures)   Collection Time: 07/27/24 10:41 AM  Result Value Ref Range   Vit D, 25-Hydroxy 62.9 30.0 - 100.0 ng/mL  TSH + free T4   Collection Time: 07/27/24 10:41 AM  Result Value Ref Range   TSH 1.440 0.450 - 4.500 uIU/mL   Free T4 1.11 0.82 - 1.77 ng/dL     Physical Exam Constitutional:      Appearance: Normal appearance.  HENT:     Head: Normocephalic.  Cardiovascular:     Rate and Rhythm: Normal rate and regular rhythm.  Pulmonary:     Effort: Pulmonary effort is normal.     Breath sounds: Normal breath sounds.  Abdominal:     General: There is no distension.     Palpations: Abdomen is soft. There is no mass.     Tenderness: There is no abdominal tenderness. There is no right CVA tenderness, left CVA tenderness, guarding or rebound.  Musculoskeletal:     Right lower leg: No edema.     Left lower leg: No edema.     Comments: No swelling, redness, warmth over right elbow. Tenderness very mild over lateral epicondyle.  Neurological:     General: No focal deficit present.     Mental Status: She is alert and oriented to person, place, and time.  Psychiatric:        Mood and Affect: Mood normal.     The 10-year ASCVD risk score (Arnett DK, et al., 2019) is: 0.2%    Assessment & Plan:  .SABRAJahel was seen today for medical management of chronic issues.  Diagnoses and all orders for this visit:  Paranoid schizophrenia (HCC) -     CBC w/Diff/Platelet -     Lipase -     CMP14+EGFR -     B12 and Folate Panel -     VITAMIN D  25 Hydroxy (Vit-D Deficiency, Fractures) -     TSH + free T4  Poor short term memory -  CBC w/Diff/Platelet -     Lipase -     CMP14+EGFR -     B12 and Folate Panel -      VITAMIN D  25 Hydroxy (Vit-D Deficiency, Fractures) -     TSH + free T4  Lower abdominal pain -     POCT URINALYSIS DIP (CLINITEK) -     Urine Culture -     CBC w/Diff/Platelet -     Lipase -     CMP14+EGFR  Immunization due -     Pfizer Comirnaty Covid -19 Vaccine 69yrs and older  Right elbow pain -     diclofenac Sodium (VOLTAREN) 1 % GEL; Apply 4 g topically 4 (four) times daily. To affected joint. -     CBC w/Diff/Platelet  Visit for screening mammogram -     MM 3D SCREENING MAMMOGRAM BILATERAL BREAST   Assessment & Plan Paranoid schizophrenia Not on risperidone  or sertraline  for over a month. Occasional sadness once a week. No hallucinations. Memory concerns possibly related to medication use. Discussed potential need for medication if symptoms worsen. - Monitor mental health symptoms. Consider reintroducing medication if symptoms worsen.  Memory impairment Difficulty remembering appointments and tasks, possibly related to past medication use. No significant change since discontinuing medication. Discussed impact of past head injury and reliance on external aids. - Encouraged memory exercises to improve recall.  Lateral epicondylitis, right arm Intermittent pain likely due to overuse and tendinitis. Previous infection resolved. No current signs of infection or trauma. - Prescribed Voltaren gel for topical application. - Ordered blood work to check white blood cell count.  Abdominal pain, lower abdomen Intermittent pain for two weeks, not associated with urinary symptoms. Normal bowel movements. Possible viral gastroenteritis or inflammation. - Recommended probiotic supplementation for 2-3 weeks. - Advised dietary modifications to avoid spicy foods. - Ordered labs to rule out other causes with cmp, cbc, lipase. - UA in office negative for leuks,blood, nitrites, protein.  Follow up if symptoms worsening or not improving.   General Health Maintenance Due for COVID  vaccine and mammogram. - Administered COVID vaccine. - Provided referral for mammogram.     Heather Bologna, PA-C

## 2024-07-28 LAB — CMP14+EGFR
ALT: 25 IU/L (ref 0–32)
AST: 57 IU/L — ABNORMAL HIGH (ref 0–40)
Albumin: 4.3 g/dL (ref 3.9–4.9)
Alkaline Phosphatase: 51 IU/L (ref 41–116)
BUN/Creatinine Ratio: 18 (ref 9–23)
BUN: 13 mg/dL (ref 6–24)
Bilirubin Total: 0.7 mg/dL (ref 0.0–1.2)
CO2: 21 mmol/L (ref 20–29)
Calcium: 9.1 mg/dL (ref 8.7–10.2)
Chloride: 103 mmol/L (ref 96–106)
Creatinine, Ser: 0.72 mg/dL (ref 0.57–1.00)
Globulin, Total: 2.2 g/dL (ref 1.5–4.5)
Glucose: 88 mg/dL (ref 70–99)
Potassium: 4.1 mmol/L (ref 3.5–5.2)
Sodium: 137 mmol/L (ref 134–144)
Total Protein: 6.5 g/dL (ref 6.0–8.5)
eGFR: 103 mL/min/1.73 (ref >59)

## 2024-07-28 LAB — CBC WITH DIFFERENTIAL/PLATELET
Basophils Absolute: 0 x10E3/uL (ref 0.0–0.2)
Basos: 1 %
EOS (ABSOLUTE): 0.4 x10E3/uL (ref 0.0–0.4)
Eos: 7 %
Hematocrit: 41.6 % (ref 34.0–46.6)
Hemoglobin: 13.5 g/dL (ref 11.1–15.9)
Immature Grans (Abs): 0 x10E3/uL (ref 0.0–0.1)
Immature Granulocytes: 0 %
Lymphocytes Absolute: 1.8 x10E3/uL (ref 0.7–3.1)
Lymphs: 31 %
MCH: 29.2 pg (ref 26.6–33.0)
MCHC: 32.5 g/dL (ref 31.5–35.7)
MCV: 90 fL (ref 79–97)
Monocytes Absolute: 0.4 x10E3/uL (ref 0.1–0.9)
Monocytes: 7 %
Neutrophils Absolute: 3.2 x10E3/uL (ref 1.4–7.0)
Neutrophils: 54 %
Platelets: 312 x10E3/uL (ref 150–450)
RBC: 4.62 x10E6/uL (ref 3.77–5.28)
RDW: 12.6 % (ref 11.7–15.4)
WBC: 5.9 x10E3/uL (ref 3.4–10.8)

## 2024-07-28 LAB — TSH+FREE T4
Free T4: 1.11 ng/dL (ref 0.82–1.77)
TSH: 1.44 u[IU]/mL (ref 0.450–4.500)

## 2024-07-28 LAB — B12 AND FOLATE PANEL
Folate: >20 ng/mL (ref >3.0)
Vitamin B-12: 929 pg/mL (ref 232–1245)

## 2024-07-28 LAB — LIPASE: Lipase: 26 U/L (ref 14–72)

## 2024-07-28 LAB — VITAMIN D 25 HYDROXY (VIT D DEFICIENCY, FRACTURES): Vit D, 25-Hydroxy: 62.9 ng/mL (ref 30.0–100.0)

## 2024-07-30 ENCOUNTER — Ambulatory Visit: Payer: Self-pay | Admitting: Physician Assistant

## 2024-07-30 ENCOUNTER — Encounter: Payer: Self-pay | Admitting: Physician Assistant

## 2024-07-30 LAB — URINE CULTURE

## 2024-07-30 NOTE — Progress Notes (Signed)
No bacteria in urine culture

## 2024-07-30 NOTE — Progress Notes (Signed)
 Ebonie,   WBC normal.  Lipase normal.  Kidney and glucose normal.  AST a little elevated could be due to viral stomach bug. Recheck in 2 weeks to make sure improving. Avoid alcohol and tylenol products.

## 2024-08-02 ENCOUNTER — Ambulatory Visit (HOSPITAL_BASED_OUTPATIENT_CLINIC_OR_DEPARTMENT_OTHER)
Admission: RE | Admit: 2024-08-02 | Discharge: 2024-08-02 | Disposition: A | Discharge status: Home / Self Care | Source: Ambulatory Visit | Attending: Physician Assistant | Admitting: Physician Assistant

## 2024-08-02 ENCOUNTER — Encounter (HOSPITAL_BASED_OUTPATIENT_CLINIC_OR_DEPARTMENT_OTHER): Payer: Self-pay

## 2024-08-02 DIAGNOSIS — Z1231 Encounter for screening mammogram for malignant neoplasm of breast: Secondary | ICD-10-CM | POA: Insufficient documentation

## 2024-08-06 NOTE — Progress Notes (Signed)
 Normal mammogram. Follow up in one year.
